# Patient Record
Sex: Female | Born: 1940
Health system: Southern US, Community
[De-identification: ages and names within clinical notes are randomized; demographics above are authoritative.]

## PROBLEM LIST (undated history)

## (undated) HISTORY — PX: TOTAL HIP ARTHROPLASTY: SHX124

## (undated) HISTORY — PX: ABDOMINAL HYSTERECTOMY: SHX81

## (undated) HISTORY — PX: WRIST SURGERY: SHX841

---

## 1998-08-30 ENCOUNTER — Encounter: Admission: RE | Admit: 1998-08-30 | Discharge: 1998-08-30 | Payer: Self-pay | Admitting: Sports Medicine

## 1998-09-03 ENCOUNTER — Encounter: Admission: RE | Admit: 1998-09-03 | Discharge: 1998-09-03 | Payer: Self-pay | Admitting: Family Medicine

## 1999-07-01 ENCOUNTER — Encounter: Admission: RE | Admit: 1999-07-01 | Discharge: 1999-07-01 | Payer: Self-pay | Admitting: Sports Medicine

## 2000-01-22 ENCOUNTER — Encounter: Payer: Self-pay | Admitting: Sports Medicine

## 2000-01-22 ENCOUNTER — Encounter: Admission: RE | Admit: 2000-01-22 | Discharge: 2000-01-22 | Payer: Self-pay | Admitting: Sports Medicine

## 2000-01-22 ENCOUNTER — Encounter: Admission: RE | Admit: 2000-01-22 | Discharge: 2000-01-22 | Payer: Self-pay | Admitting: Family Medicine

## 2000-10-21 ENCOUNTER — Encounter: Admission: RE | Admit: 2000-10-21 | Discharge: 2000-10-21 | Payer: Self-pay | Admitting: Family Medicine

## 2001-01-11 ENCOUNTER — Encounter: Admission: RE | Admit: 2001-01-11 | Discharge: 2001-01-11 | Payer: Self-pay | Admitting: Family Medicine

## 2002-09-08 ENCOUNTER — Encounter: Admission: RE | Admit: 2002-09-08 | Discharge: 2002-09-08 | Payer: Self-pay | Admitting: Sports Medicine

## 2003-08-05 ENCOUNTER — Encounter: Payer: Self-pay | Admitting: Orthopedic Surgery

## 2003-08-05 ENCOUNTER — Ambulatory Visit (HOSPITAL_COMMUNITY): Admission: AD | Admit: 2003-08-05 | Discharge: 2003-08-06 | Payer: Self-pay | Admitting: Orthopedic Surgery

## 2004-11-22 ENCOUNTER — Ambulatory Visit (HOSPITAL_COMMUNITY): Admission: RE | Admit: 2004-11-22 | Discharge: 2004-11-22 | Payer: Self-pay | Admitting: Gastroenterology

## 2005-06-17 ENCOUNTER — Ambulatory Visit: Payer: Self-pay | Admitting: Sports Medicine

## 2005-06-17 ENCOUNTER — Encounter: Admission: RE | Admit: 2005-06-17 | Discharge: 2005-06-17 | Payer: Self-pay | Admitting: Sports Medicine

## 2006-11-11 ENCOUNTER — Encounter: Admission: RE | Admit: 2006-11-11 | Discharge: 2006-11-11 | Payer: Self-pay | Admitting: Sports Medicine

## 2007-01-06 ENCOUNTER — Ambulatory Visit: Payer: Self-pay | Admitting: Sports Medicine

## 2007-01-06 LAB — CONVERTED CEMR LAB
ALT: 17 units/L (ref 0–35)
AST: 15 units/L (ref 0–37)
Albumin: 4.6 g/dL (ref 3.5–5.2)
Alkaline Phosphatase: 59 units/L (ref 39–117)
BUN: 14 mg/dL (ref 6–23)
CO2: 24 meq/L (ref 19–32)
Calcium: 9.4 mg/dL (ref 8.4–10.5)
Chloride: 105 meq/L (ref 96–112)
Cholesterol: 225 mg/dL — ABNORMAL HIGH (ref 0–200)
Creatinine, Ser: 0.6 mg/dL (ref 0.40–1.20)
Glucose, Bld: 99 mg/dL (ref 70–99)
HDL: 64 mg/dL (ref >39)
LDL Cholesterol: 139 mg/dL — ABNORMAL HIGH (ref 0–99)
Potassium: 3.9 meq/L (ref 3.5–5.3)
Sodium: 140 meq/L (ref 135–145)
Total Bilirubin: 0.6 mg/dL (ref 0.3–1.2)
Total CHOL/HDL Ratio: 3.5
Total Protein: 7 g/dL (ref 6.0–8.3)
Triglycerides: 108 mg/dL (ref <150)
VLDL: 22 mg/dL (ref 0–40)

## 2007-01-26 ENCOUNTER — Ambulatory Visit: Payer: Self-pay | Admitting: Family Medicine

## 2007-01-28 ENCOUNTER — Ambulatory Visit: Payer: Self-pay | Admitting: Sports Medicine

## 2007-02-18 DIAGNOSIS — E785 Hyperlipidemia, unspecified: Secondary | ICD-10-CM | POA: Insufficient documentation

## 2007-02-18 DIAGNOSIS — M19049 Primary osteoarthritis, unspecified hand: Secondary | ICD-10-CM | POA: Insufficient documentation

## 2007-02-18 DIAGNOSIS — H1045 Other chronic allergic conjunctivitis: Secondary | ICD-10-CM | POA: Insufficient documentation

## 2007-06-21 ENCOUNTER — Encounter: Payer: Self-pay | Admitting: Sports Medicine

## 2007-06-22 ENCOUNTER — Encounter: Payer: Self-pay | Admitting: Sports Medicine

## 2007-11-17 ENCOUNTER — Encounter: Payer: Self-pay | Admitting: Sports Medicine

## 2008-01-17 ENCOUNTER — Ambulatory Visit: Payer: Self-pay | Admitting: Sports Medicine

## 2008-05-29 ENCOUNTER — Encounter (INDEPENDENT_AMBULATORY_CARE_PROVIDER_SITE_OTHER): Payer: Self-pay | Admitting: *Deleted

## 2008-05-30 ENCOUNTER — Ambulatory Visit: Payer: Self-pay | Admitting: Sports Medicine

## 2008-05-30 DIAGNOSIS — L570 Actinic keratosis: Secondary | ICD-10-CM | POA: Insufficient documentation

## 2008-05-30 LAB — CONVERTED CEMR LAB
AST: 18 units/L (ref 0–37)
Albumin: 4.4 g/dL (ref 3.5–5.2)
Alkaline Phosphatase: 56 units/L (ref 39–117)
BUN: 13 mg/dL (ref 6–23)
HDL: 63 mg/dL (ref >39)
LDL Cholesterol: 152 mg/dL — ABNORMAL HIGH (ref 0–99)
Potassium: 4.7 meq/L (ref 3.5–5.3)
Sodium: 140 meq/L (ref 135–145)
Total Protein: 7 g/dL (ref 6.0–8.3)
VLDL: 21 mg/dL (ref 0–40)

## 2008-06-02 ENCOUNTER — Encounter (INDEPENDENT_AMBULATORY_CARE_PROVIDER_SITE_OTHER): Payer: Self-pay | Admitting: *Deleted

## 2008-07-04 ENCOUNTER — Ambulatory Visit: Payer: Self-pay | Admitting: Sports Medicine

## 2008-07-04 DIAGNOSIS — C4490 Unspecified malignant neoplasm of skin, unspecified: Secondary | ICD-10-CM

## 2008-09-01 ENCOUNTER — Ambulatory Visit: Payer: Self-pay | Admitting: Sports Medicine

## 2008-09-25 ENCOUNTER — Encounter: Payer: Self-pay | Admitting: Family Medicine

## 2008-09-25 LAB — CONVERTED CEMR LAB: Triglycerides: 72 mg/dL

## 2008-10-10 ENCOUNTER — Encounter: Payer: Self-pay | Admitting: Sports Medicine

## 2008-11-09 ENCOUNTER — Encounter: Payer: Self-pay | Admitting: Family Medicine

## 2008-11-29 ENCOUNTER — Encounter: Payer: Self-pay | Admitting: Family Medicine

## 2009-01-16 ENCOUNTER — Ambulatory Visit: Payer: Self-pay | Admitting: Sports Medicine

## 2009-01-17 ENCOUNTER — Encounter: Payer: Self-pay | Admitting: Sports Medicine

## 2009-01-22 ENCOUNTER — Telehealth: Payer: Self-pay | Admitting: Family Medicine

## 2009-01-29 ENCOUNTER — Ambulatory Visit: Payer: Self-pay | Admitting: Sports Medicine

## 2009-02-12 LAB — CONVERTED CEMR LAB

## 2009-02-13 ENCOUNTER — Ambulatory Visit (HOSPITAL_COMMUNITY): Admission: RE | Admit: 2009-02-13 | Discharge: 2009-02-13 | Payer: Self-pay | Admitting: Family Medicine

## 2009-02-13 ENCOUNTER — Ambulatory Visit: Payer: Self-pay | Admitting: Family Medicine

## 2009-02-13 DIAGNOSIS — J301 Allergic rhinitis due to pollen: Secondary | ICD-10-CM

## 2009-02-13 LAB — CONVERTED CEMR LAB
ALT: 19 units/L (ref 0–35)
AST: 18 units/L (ref 0–37)
Alkaline Phosphatase: 62 units/L (ref 39–117)
Calcium: 10.4 mg/dL (ref 8.4–10.5)
Chloride: 101 meq/L (ref 96–112)
Creatinine, Ser: 0.63 mg/dL (ref 0.40–1.20)

## 2009-02-15 ENCOUNTER — Encounter: Payer: Self-pay | Admitting: Family Medicine

## 2009-02-23 ENCOUNTER — Encounter: Payer: Self-pay | Admitting: Family Medicine

## 2009-02-26 ENCOUNTER — Encounter: Payer: Self-pay | Admitting: *Deleted

## 2009-04-12 ENCOUNTER — Encounter: Payer: Self-pay | Admitting: Family Medicine

## 2009-04-23 ENCOUNTER — Encounter: Payer: Self-pay | Admitting: Family Medicine

## 2009-08-15 ENCOUNTER — Ambulatory Visit: Payer: Self-pay | Admitting: Sports Medicine

## 2009-08-16 ENCOUNTER — Encounter: Payer: Self-pay | Admitting: Family Medicine

## 2009-08-22 ENCOUNTER — Encounter: Payer: Self-pay | Admitting: Sports Medicine

## 2009-08-28 ENCOUNTER — Encounter: Payer: Self-pay | Admitting: Family Medicine

## 2009-09-21 ENCOUNTER — Ambulatory Visit: Payer: Self-pay | Admitting: Sports Medicine

## 2009-09-21 DIAGNOSIS — L980 Pyogenic granuloma: Secondary | ICD-10-CM | POA: Insufficient documentation

## 2009-10-02 ENCOUNTER — Encounter: Payer: Self-pay | Admitting: Family Medicine

## 2009-10-03 ENCOUNTER — Ambulatory Visit: Payer: Self-pay | Admitting: Family Medicine

## 2009-11-06 ENCOUNTER — Ambulatory Visit: Payer: Self-pay | Admitting: Sports Medicine

## 2009-11-06 DIAGNOSIS — IMO0002 Reserved for concepts with insufficient information to code with codable children: Secondary | ICD-10-CM

## 2009-11-12 ENCOUNTER — Ambulatory Visit: Payer: Self-pay | Admitting: Family Medicine

## 2009-11-12 DIAGNOSIS — S62109A Fracture of unspecified carpal bone, unspecified wrist, initial encounter for closed fracture: Secondary | ICD-10-CM | POA: Insufficient documentation

## 2009-12-31 ENCOUNTER — Encounter: Payer: Self-pay | Admitting: Family Medicine

## 2010-05-08 ENCOUNTER — Encounter: Payer: Self-pay | Admitting: Family Medicine

## 2010-11-22 ENCOUNTER — Telehealth (INDEPENDENT_AMBULATORY_CARE_PROVIDER_SITE_OTHER): Payer: Self-pay | Admitting: *Deleted

## 2010-12-05 ENCOUNTER — Ambulatory Visit: Payer: Self-pay | Admitting: Family Medicine

## 2010-12-05 ENCOUNTER — Encounter: Payer: Self-pay | Admitting: Family Medicine

## 2010-12-05 LAB — CONVERTED CEMR LAB
ALT: 14 units/L (ref 0–35)
AST: 16 units/L (ref 0–37)
Albumin: 4.6 g/dL (ref 3.5–5.2)
CO2: 23 meq/L (ref 19–32)
Calcium: 8.7 mg/dL (ref 8.4–10.5)
Chloride: 100 meq/L (ref 96–112)
Cholesterol: 168 mg/dL (ref 0–200)
Creatinine, Ser: 0.56 mg/dL (ref 0.40–1.20)
Potassium: 3.6 meq/L (ref 3.5–5.3)
Sodium: 134 meq/L — ABNORMAL LOW (ref 135–145)
Total Protein: 7.2 g/dL (ref 6.0–8.3)
Vit D, 25-Hydroxy: 44 ng/mL (ref 30–89)

## 2010-12-11 ENCOUNTER — Encounter: Payer: Self-pay | Admitting: Family Medicine

## 2010-12-13 ENCOUNTER — Encounter: Payer: Self-pay | Admitting: Family Medicine

## 2011-01-12 ENCOUNTER — Encounter: Payer: Self-pay | Admitting: Sports Medicine

## 2011-01-21 NOTE — Miscellaneous (Signed)
  Medications Added ALENDRONATE SODIUM 70 MG TABS (ALENDRONATE SODIUM) 1 by mouth q week       Clinical Lists Changes  Medications: Added new medication of ALENDRONATE SODIUM 70 MG TABS (ALENDRONATE SODIUM) 1 by mouth q week Observations: Added new observation of LLIMPORTMEDS: completed (12/31/2009 13:41)

## 2011-01-21 NOTE — Letter (Signed)
Summary: Wellness Visit Letter  [facility]  [address]   [place], [place] [address]   Phone: [phone]  Fax: [phone]    05/08/2010  Margaret Barton [address] [place], [place]  [address]  Dear Ms. Deangelo,   We are happy to let you know that since you are covered under Medicare you are able to have a FREE visit at the [facility] to discuss your HEALTH. This is a new benefit for Medicare.  There will be no co-payment.  At this visit you will meet with Luretha Murphy an expert in wellness and the nurse practitioner at our clinic.  At this visit we will discuss ways to keep you healthy and feeling well.  This visit will not replace your regular doctor visit and we cannot refill medications.  We may schedule future blood work, give shots if needed, or schedule tests to look for hidden problems.   You will need to plan to be here at least one hour to talk about your medical history, your current status, review all of your medications, and discuss your future plans for your health.  This information will be entered into your record for your doctor to have and review.  If you are interested in staying healthy, this type of visit can help.  Please call the office at: [phone], to schedule a "Medicare Wellness Visit".  The day of the visit you should bring in all of your medications, including any vitamins, herbs, over the counter products you take.  Make a list of all the other doctors that you see, so we know who they are. If you have any other health documents please bring them.  We look forward to helping you stay healthy.   Sincerely,   Luretha Murphy NP  Appended Document: Wellness Visit Letter mailed.

## 2011-01-21 NOTE — Progress Notes (Signed)
Summary: phn msg  Phone Note Call from Patient Call back at Home Phone [phone]   Caller: Patient Summary of Call: no appt for phys in Dec - would like to put cpe in [facility] clinic at 8:30 on 12/15 pls advise Initial call taken by: De Nurse,  November 22, 2010 8:40 AM    admin that is A ok to put her there. Please let her know Thanks!  Denny Levy MD  November 25, 2010 4:58 PM  appt set for 12/15 De Nurse  November 26, 2010 3:41 PM

## 2011-01-23 NOTE — Letter (Signed)
Summary: Lipid Letter  [facility]  [address]   [place], [place] [address]   Phone: [phone]  Fax: [phone]    12/11/2010  Jaline Pincock [address] [place], [place]  [address]  Dear Ms. 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 And all oft the other labs including blood sugar, electrolytes, kidney, liver function and Vitamin D were OK as well.   We could potentially think about decreasing your simvastatin to 20 mg a day--OR one 40 mg tab every other day. In fact, let's try that and recheck in about 4 months.  Have a Happy Holiday!  We have carefully reviewed your last lipid profile from 12/05/2010 and the results are noted below with a summary of recommendations for lipid management.    Cholesterol:       168     Goal: < 200   HDL "good" Cholesterol:   69     Goal: >45   LDL "bad" Cholesterol:   81     Goal: <100   Triglycerides:       88     Goal: <150     Adjunctive Measures (may lower LIPIDS and reduce risk of Heart Attack) include: Aerobic Exercise (20-30 minutes 3-4 times a week) Limit Alcohol Consumption Weight Reduction Aspirin 75-81 mg a day by mouth (if not allergic or contraindicated) Dietary Fiber 20-30 grams a day by mouth     Current Medications: 1)    Nasonex 50 Mcg/act  Susp (Mometasone furoate) .... 2 puffs two times a day each nostril for 3 daysn and then once daily 2)    Simvastatin 40 Mg  Tabs (Simvastatin) .Marland Kitchen.. 1 by mouth qhs 3)    Singulair 10 Mg Tabs (Montelukast sodium) .Marland Kitchen.. 1 by mouth qd 4)    Patanol 0.1 % Soln (Olopatadine hcl) .Marland Kitchen.. 1 drop each eye two times a day as needed redness / itching of eyes disp 3 m supply 5)    Estraderm 0.1 Mg/24hr Pttw (Estradiol) .... Apply as directed twice a week 6)    Alendronate Sodium 70 Mg Tabs (Alendronate sodium) .Marland Kitchen.. 1 by mouth q week  If you have any questions, please call. We appreciate being able to work with you.   Sincerely,    [facility] Denny Levy  MD  Appended Document: Lipid Letter mailed

## 2011-01-23 NOTE — Miscellaneous (Signed)
Clinical Lists Changes  Medications: Added new medication of ZITHROMAX 250 MG TABS (AZITHROMYCIN) sig 2 by mouth today and then 1 by mouth once daily for four more days - Signed Added new medication of TESSALON 200 MG CAPS (BENZONATATE) 1 by mouth q 8 hrs as needed cough do not crush, swallow whole pill - Signed Added new medication of TYLENOL WITH CODEINE #3 300-30 MG TABS (ACETAMINOPHEN-CODEINE) 1-2 by mouth q 6-8 as needed cough - Signed Rx of ZITHROMAX 250 MG TABS (AZITHROMYCIN) sig 2 by mouth today and then 1 by mouth once daily for four more days;  #5 x 1;  Signed;  Entered by: Denny Levy MD;  Authorized by: Denny Levy MD;  Method used: [facility] [phone]*, [address] [place], [place]  [address], Ph: [phone] or [phone], Fax: [phone] Rx of TESSALON 200 MG CAPS (BENZONATATE) 1 by mouth q 8 hrs as needed cough do not crush, swallow whole pill;  #45 x 1;  Signed;  Entered by: Denny Levy MD;  Authorized by: Denny Levy MD;  Method used: Electronically to [facility] [phone]*, [address] [place], [place]  [address], Ph: [phone] or [phone], Fax: [phone] Rx of TYLENOL WITH CODEINE #3 300-30 MG TABS (ACETAMINOPHEN-CODEINE) 1-2 by mouth q 6-8 as needed cough;  #30 x 1;  Signed;  Entered by: Denny Levy MD;  Authorized by: Denny Levy MD;  Method used: Telephoned to [facility] [phone]*, [address] [place], [place]  [address], Ph: [phone] or [phone], Fax: [phone] Observations: Added new observation of NKA: T (12/13/2010 11:44)    Prescriptions: TYLENOL WITH CODEINE #3 300-30 MG TABS (ACETAMINOPHEN-CODEINE) 1-2 by mouth q 6-8 as needed cough  #30 x 1   Entered and Authorized by:   Denny Levy MD   Signed by:   Denny Levy MD on 12/13/2010   Method used:   Telephoned to ...       [facility]  [address] [phone]* (retail)       [address]       [place], [place]  [address]       Ph: [phone] or [phone]       Fax:  [phone]   RxID:   [id number] TESSALON 200 MG CAPS (BENZONATATE) 1 by mouth q 8 hrs as needed cough do not crush, swallow whole pill  #45 x 1   Entered and Authorized by:   Denny Levy MD   Signed by:   Denny Levy MD on 12/13/2010   Method used:   Electronically to        [facility]  [address] [phone]* (retail)       [address]       [place], [place]  [address]       Ph: [phone] or [phone]       Fax: [phone]   RxID:   [id number] ZITHROMAX 250 MG TABS (AZITHROMYCIN) sig 2 by mouth today and then 1 by mouth once daily for four more days  #5 x 1   Entered and Authorized by:   Denny Levy MD   Signed by:   Denny Levy MD on 12/13/2010   Method used:   [facility]ron[facility] to        [facility]  [address] [phone]* (retail)       [address]       [place], [place]  [address]       Ph: [phone] or [phone]       Fax:  [phone]   RxID:   [id number]

## 2011-01-23 NOTE — Assessment & Plan Note (Signed)
Summary: cpe,df   Vital Signs:  Patient profile:   70 year old female Weight:      151.6 pounds Temp:     98.5 degrees F oral Pulse rate:   70 / minute Pulse rhythm:   regular BP sitting:   124 / 74  (left arm) Cuff size:   regular  Vitals Entered By: Loralee Pacas CMA (December 05, 2010 8:44 AM) CC: cpe   CC:  cpe.  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 Trying to follow a good diet, taking medicines regularly. Not having any problems with medicines, no myalgias and no fatigue.  her allergies pretty well controlledon singulair andnasonex   Current Medications (verified): 1)  Nasonex 50 Mcg/act  Susp (Mometasone Furoate) .... 2 Puffs Two Times A Day Each Nostril For 3 Daysn and Then Once Daily 2)  Simvastatin 40 Mg  Tabs (Simvastatin) .Marland Kitchen.. 1 By Mouth Qhs 3)  Singulair 10 Mg Tabs (Montelukast Sodium) .Marland Kitchen.. 1 By Mouth Qd 4)  Patanol 0.1 % Soln (Olopatadine Hcl) .Marland Kitchen.. 1 Drop Each Eye Two Times A Day As Needed Redness / Itching of Eyes Disp 3 M Supply 5)  Estraderm 0.1 Mg/24hr Pttw (Estradiol) .... Apply As Directed Twice A Week 6)  Alendronate Sodium 70 Mg Tabs (Alendronate Sodium) .Marland Kitchen.. 1 By Mouth Q Week  Allergies (verified): No Known Drug Allergies  Review of Systems       complete 14 point ros is negative  Physical Exam  General:  alert, well-developed, well-nourished, and well-hydrated.   Eyes:  vision grossly intact, pupils equal, pupils round, and pupils reactive to light.   Ears:  R ear normal and L ear normal.   Nose:  no external deformity.   Mouth:  good dentition, no gingival abnormalities, and pharynx pink and moist.   Neck:  supple, full ROM, and no masses.   Breasts:  deferred to GYN Lungs:  normal respiratory  effort and normal breath sounds.   Heart:  normal rate, regular rhythm, and no murmur.   Abdomen:  soft.   Genitalia:  deferred to gyn Msk:  B hips some loss of ir/er/ so otherwise msk exam is normalme OA changed fingers Pulses:  2+ B radial and DP Extremities:  no edema Neurologic:  alert & oriented X3, gait normal, and DTRs symmetrical and normal.   Skin:  lots of solar damage (followed by derm so complete skin exam not done) Psych:  Oriented X3, memory intact for recent and remote, normally interactive, good eye contact, not anxious appearing, and not depressed appearing.      Impression & Recommendations:  Problem # 1:  PREVENTIVE HEALTH CARE (ICD-V70.0)  has had recent mammo and flu shot pneumovax and colonoscopy oin date will check vit D level  Orders: [facility] - [facility]  65+ [phone])  Problem # 2:  HYPERLIPIDEMIA (ICD-272.4)  Her updated medication list for this problem includes:    Simvastatin 40 Mg Tabs (Simvastatin) .Marland Kitchen... 1 by mouth qhs  Orders: Comp [facility] ([address]454-09811) Lipid[facility] ([address]1478-29562)  Complete Medication List: 1)  Nasonex 50 Mcg/act Susp (Mometasone furoate) .... 2 puffs two times a day each nostril for 3 daysn and then once daily 2)  Simvastatin 40 Mg Tabs (Simvastatin) .Marland KitchenMarland KitchenMarland Kitchen 1  by mouth qhs 3)  Singulair 10 Mg Tabs (Montelukast sodium) .Marland Kitchen.. 1 by mouth qd 4)  Patanol 0.1 % Soln (Olopatadine hcl) .Marland Kitchen.. 1 drop each eye two times a day as needed redness / itching of eyes disp 3 m supply 5)  Estraderm 0.1 Mg/24hr Pttw (Estradiol) .... Apply as directed twice a week 6)  Alendronate Sodium 70 Mg Tabs (Alendronate sodium) .Marland Kitchen.. 1 by mouth q week  Other Orders: Vit D, 25 [facility] [phone])   Orders Added: 1)  Comp [facility] [63875-64332] 2)  Lipid[facility] [80061-22930] 3)  Vit D, 25 [facility] [95188-41660] 4)  [facility] - Est  65+ [[address]]    Prevention & Chronic Care Immunizations   Influenza vaccine: Fluvax MCR  (10/03/2009)    Tetanus booster: 02/13/2009: given    Tetanus booster due: 02/13/2019    Pneumococcal vaccine: given  (02/13/2009)   Pneumococcal vaccine due: None    H. zoster vaccine: Not documented  Colorectal Screening   Hemoccult: Not documented    Colonoscopy: normal  (10/24/2004)   Colonoscopy due: 10/24/2014  Other Screening   Pap smear: s/p hysterectomy USO  (02/12/2009)   Pap smear due: Not Indicated    Mammogram: normal  (10/24/2008)   Mammogram due: 10/24/2009    DXA bone density scan: 09/25/2008 lumbar spine -2.0 osteopenia left hip neck -1.7  note right hip is thr  Dec 04, 2005 lumbar spine  -1.5  left hip neck -0.9  05/31/2003 lumbar spine -2.9 left hip neck  -1.7  (04/23/2009)   DXA scan due: 10/26/2010    Smoking status: never  (10/03/2009)  Lipids   Total Cholesterol: 204  (04/23/2009)   LDL: 120  (04/23/2009)   LDL Direct: Not documented   HDL: 70  (04/23/2009)   Triglycerides: 72  (09/25/2008)    SGOT (AST): 18  (02/13/2009)   SGPT (ALT): 19  (02/13/2009) CMP ordered    Alkaline phosphatase: 62  (02/13/2009)   Total bilirubin: 0.4  (02/13/2009)  Self-Management Support :    Lipid self-management support: Not documented

## 2011-02-28 ENCOUNTER — Encounter (INDEPENDENT_AMBULATORY_CARE_PROVIDER_SITE_OTHER): Payer: Self-pay | Admitting: *Deleted

## 2011-03-04 ENCOUNTER — Encounter: Payer: Self-pay | Admitting: Home Health Services

## 2011-03-04 ENCOUNTER — Other Ambulatory Visit: Payer: Self-pay | Admitting: Family Medicine

## 2011-03-04 MED ORDER — MONTELUKAST SODIUM 10 MG PO TABS: 10.0000 mg | ORAL_TABLET | Freq: Every day | ORAL | Status: DC

## 2011-03-04 NOTE — Miscellaneous (Signed)
  Clinical Lists Changes  Medications: Added new medication of LORAZEPAM 1 MG TABS (LORAZEPAM) 1/2 - 1 by mouth two times a day as needed anxiety - Signed Rx of SINGULAIR 10 MG TABS (MONTELUKAST SODIUM) 1 by mouth qd;  #30 x 12;  Signed;  Entered by: Denny Levy MD;  Authorized by: Denny Levy MD;  Method used: Electron[facility] to [facility] [phone]*, [address] [place], [place]  [address], Ph: [phone] or [phone], Fax: [phone] Rx of LORAZEPAM 1 MG TABS (LORAZEPAM) 1/2 - 1 by mouth two times a day as needed anxiety;  #30 x 0;  Signed;  Entered by: Denny Levy MD;  Authorized by: Denny Levy MD;  Method used: Telephoned to [facility]  [facility] [phone]*, [address], [place], [place]  [address], Ph: [phone] or [phone], Fax: [phone]  Sherell Christoffel please call in the ativan fo her Thanks! Denny Levy MD  Called in.  Lillia Pauls CMA  February 28, 2011 11:58 AM     Prescriptions: LORAZEPAM 1 MG TABS (LORAZEPAM) 1/2 - 1 by mouth two times a day as needed anxiety  #30 x 0   Entered and Authorized by:   Denny Levy MD   Signed by:   Denny Levy MD on 02/28/2011   Method used:   Telephoned to ...       [facility]  [address] [phone]* (retail)       [address]       [place], [place]  [address]       Ph: [phone] or [phone]       Fax: [phone]   RxID:   [id number] SINGULAIR 10 MG TABS (MONTELUKAST SODIUM) 1 by mouth qd  #30 x 12   Entered and Authorized by:   Denny Levy MD   Signed by:   Denny Levy MD on 02/28/2011   Method used:   Electronically to        [facility]  [address] [phone]* (retail)       [address]       [place], [place]  [address]       Ph: [phone] or [phone]       Fax: [phone]   RxID:   [id number]

## 2011-03-11 ENCOUNTER — Encounter: Payer: Self-pay | Admitting: Family Medicine

## 2011-03-20 NOTE — Miscellaneous (Signed)
  Clinical Lists Changes       Complete Medication List: 1)  Nasonex 50 Mcg/act Susp (Mometasone furoate) .... 2 puffs two times a day each nostril for 3 daysn and then once daily 2)  Simvastatin 40 Mg Tabs (Simvastatin) .Marland Kitchen.. 1 by mouth qhs 3)  Singulair 10 Mg Tabs (Montelukast sodium) .Marland Kitchen.. 1 by mouth qd 4)  Patanol 0.1 % Soln (Olopatadine hcl) .Marland Kitchen.. 1 drop each eye two times a day as needed redness / itching of eyes disp 3 m supply 5)  Estraderm 0.1 Mg/24hr Pttw (Estradiol) .... Apply as directed twice a week 6)  Alendronate Sodium 70 Mg Tabs (Alendronate sodium) .Marland Kitchen.. 1 by mouth q week 7)  Zithromax 250 Mg Tabs (Azithromycin) .... Sig 2 by mouth today and then 1 by mouth once daily for four more days 8)  Tessalon 200 Mg Caps (Benzonatate) .Marland Kitchen.. 1 by mouth q 8 hrs as needed cough do not crush, swallow whole pill 9)  Tylenol With Codeine #3 300-30 Mg Tabs (Acetaminophen-codeine) .Marland Kitchen.. 1-2 by mouth q 6-8 as needed cough 10)  Lorazepam 1 Mg Tabs (Lorazepam) .... 1/2 - 1 by mouth two times a day as needed anxiety   Past History:  Past Medical History: Last updated: 2009/02/24 endometriosis s/p hysterectomy USO  pneumonia 2001 removal squamous cell carcinoma tibia  Past Surgical History: Last updated: 2009/02/24 hystectomy/USO - 08/22/1984,  Lipid Panel 01/06/2007  TC=225, LDL=139, HDL=64, TG=108 -  left wrist fx with plate repair R THR Dr Thurston Hole  Family History: Last updated: February 24, 2009 father died 64 copd,  Margaret Barton (younger brothe)r  with MR and hyperlipidemia, s/p MI mother died 50 MI and aodm/ depression,  sister 80 A&W  Social History: Last updated: February 24, 2009 retired Financial controller for [employer];  no tobacco;  social etoh;  lives with husband Margaret Barton [phone]) several grandchildren 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 ctive in gardening, still working part  time.

## 2011-04-07 ENCOUNTER — Ambulatory Visit: Payer: Self-pay | Admitting: Home Health Services

## 2011-04-14 ENCOUNTER — Ambulatory Visit (INDEPENDENT_AMBULATORY_CARE_PROVIDER_SITE_OTHER): Payer: Medicare Other | Admitting: Home Health Services

## 2011-04-14 ENCOUNTER — Encounter: Payer: Self-pay | Admitting: Home Health Services

## 2011-04-14 VITALS — BP 131/74 | HR 69 | Temp 98.1°F | Ht 62.5 in | Wt 150.0 lb

## 2011-04-14 DIAGNOSIS — Z0000 Encounter for general adult medical examination without abnormal findings: Secondary | ICD-10-CM

## 2011-04-14 NOTE — Patient Instructions (Signed)
1. Continue to work on fitting into clothes in the closet. 2. Finalize your medical wishes, discuss with family, give copy to Dr. Jennette Kettle. 3. Enjoy your grandchildren!

## 2011-04-14 NOTE — Progress Notes (Signed)
Patient here for annual wellness visit, patient reports: Risk Factors/Conditions needing evaluation or treatment: Patient does not have any risk factors that need evaluation. Home Safety: Patient lives in 2 story home with husband.  Patient reports having smoke detectors and does not have adaptive equipment in bathrooms. Other Information: Corrective lens: Patient wears corrective lens for reading and visits eye doctor annually. Dentures: Patient does not have dentures and visits dentist annually. Memory: Patient denies memory loss. Patient's Mini Mental Score (recorded in doc. flowsheet): 30  Balance max value patientvalue  Sitting balance 1 1  Arise 2 2  Attempts to arise 2 2  Immediate standing balance 2 2  Standing balance 1 1  Nudge 2 2  Eyes closed 1 1  360 degree turn 1 1  Sitting down 2 2   Gait max value patient value  Initiation of gait 1 1  Step length-left 1 1  Step length-right 1 1  Step height-left 1 1  Step height-right 1 1  Step symmetry 1 1  Step continuity 1 1  Path 2 2  Trunk 2 2  Walking stance 1 1   Balance/Gait Score: 26/26    Annual Wellness Visit Requirements Recorded Today In  Medical, family, social history Past Medical, Family, Social History Section  Current providers Care team  Current medications Medications  Wt, BP, Ht, BMI Vital signs  Hearing assessment (welcome visit) declined  Tobacco, alcohol, illicit drug use History  ADL Nurse Assessment  Depression Screening Nurse Assessment  Cognitive impairment Nurse Assessment  Mini Mental Status Document Flowsheet  Fall Risk Nurse Assessment  Home Safety Progress Note  End of Life Planning (welcome visit) Social Documentation  Medicare preventative services Progress Note  Risk factors/conditions needing evaluation/treatment Progress Note  Personalized health advice Patient Instructions, goals, letter  Diet & Exercise Social Documentation  Emergency Contact Social Documentation  Seat  Belts Social Documentation  Sun exposure/protection Social Documentation    Prevention Plan: Recommended patient follow up with pharmacy for shingles vaccine.  Recommended Medicare Prevention Screenings Women over 82 Test For Frequency Date of Last- BOLD if needed  Breast Cancer 1-2 yrs 11/09  Cervical Cancer 1-3 yrs hysterectomy  Colorectal Cancer 1-10 yrs 11/05  Osteoporosis once 5/10  Cholesterol 5 yrs 12/11  Diabetes yearly Non diabetic  HIV yearly declined  Influenza Shot yearly 9/11  Pneumonia Shot once 2/10  Zostavax Shot once recommended

## 2011-04-22 ENCOUNTER — Telehealth: Payer: Self-pay | Admitting: *Deleted

## 2011-04-22 NOTE — Telephone Encounter (Signed)
PA required for Singulair. Form placed in MD box. 

## 2011-04-24 NOTE — Telephone Encounter (Signed)
Received notice from  insurance company that Singulair has be denied because Singulair is not covered for diagnosis of perennial allergic rhinitis.

## 2011-04-25 ENCOUNTER — Encounter: Payer: Self-pay | Admitting: Home Health Services

## 2011-04-25 NOTE — Telephone Encounter (Signed)
LVM for patient to call back. ?

## 2011-04-25 NOTE — Telephone Encounter (Signed)
Dear Cliffton Asters Team Please call her and tell her that her INSURANCE has denied the singulair for coverage. I think we went through this once before---she can still fill rx but it may be pretty pricey---she can APPEAl to her insurance company--I filled out the prior auth and it was denied. If she has questions please let me know and I wll call her THANKS! Denny Levy

## 2011-04-25 NOTE — Progress Notes (Signed)
  Subjective:    Patient ID: Margaret Barton, female    DOB: 1941-01-10, 70 y.o.   MRN: [id number]  HPI    Review of Systems     Objective:   Physical Exam        Assessment & Plan:  I have reviewed this visit and discussed with Arlys John and agree with her documentation.  Denny Levy

## 2011-04-25 NOTE — Telephone Encounter (Signed)
Also discussed her brother's death and she is doing much better from  grieft rx standpoint. Used about half of th meds I gave her. Briefly discussed recurrences and possible anniversary rx

## 2011-04-25 NOTE — Telephone Encounter (Signed)
Dear Cliffton Asters Team That is a REALLY good question. I imagine it was her private ins--the BEST thing she could do is  Call her PHARMACIST and talk with him about it. If there is another form to fill out I will be happy to do that I have called her and conveyed this

## 2011-04-25 NOTE — Telephone Encounter (Signed)
Patient had called back and she was wanting to know if all 3 of her insurance companies have denied the rx?

## 2011-04-25 NOTE — Telephone Encounter (Signed)
Called back and left message  on voicemail that if she wants RX billed to another insurance she should contact her pharmacy with that information , then if it does not go thru they will send [place] information to contact insurance company., but she has to give pharmacy other insurance info to get the process going.Margaret Barton

## 2011-05-09 NOTE — Op Note (Signed)
NAMEJATON, Margaret Barton                  ACCOUNT NO.:  [id number]   MEDICAL RECORD NO.:  [id number]          PATIENT TYPE:  AMB   LOCATION:  ENDO                         FACILITY:  [facility]   PHYSICIAN:  John C. Madilyn Fireman, M.D.    DATE OF BIRTH:  16-Mar-1941   DATE OF PROCEDURE:  11/22/2004  DATE OF DISCHARGE:                                 OPERATIVE REPORT   PROCEDURE:  Colonoscopy.   INDICATION FOR PROCEDURE:  Average risk colon cancer screening.   DESCRIPTION OF PROCEDURE:  The patient was placed in the left lateral  decubitus position and placed on the pulse monitor with continuous low-flow  oxygen delivered by nasal cannula.  She was sedated with 50 mcg IV fentanyl  and 6 mg IV Versed.  The Olympus video colonoscope was inserted into the  rectum and advanced to the cecum, confirmed by transillumination at  McBurney's point and visualization at the ileocecal valve and appendiceal  orifice.  The prep was excellent.  The cecum, ascending, transverse,  descending, and sigmoid colon all appeared normal with no masses, polyps,  diverticula, or other mucosal abnormalities.  The rectum likewise appeared  normal, and retroflexed view of the anus revealed no obvious internal  hemorrhoids.  The scope was then withdrawn and the patient returned to the  recovery room in stable condition.  She tolerated the procedure well, and  there were no immediate complications.   IMPRESSION:  Normal colonoscopy.   PLAN:  Next colon screening by sigmoidoscopy in 5 years.      [facility]/MEDQ  D:  11/22/2004  T:  11/22/2004  Job:  [id number]   cc:   Sibyl Parr. Darrick Penna, M.D.  Fax: [phone]

## 2011-05-09 NOTE — Op Note (Signed)
NAME:  Margaret Barton, Margaret Barton NO.:  [id number]   MEDICAL RECORD NO.:  [id number]                   PATIENT TYPE:  OIB   LOCATION:  2899                                 FACILITY:  [facility]   PHYSICIAN:  Dionne Ano. Everlene Other, M.D.         DATE OF BIRTH:  1941/09/09   DATE OF PROCEDURE:  08/05/2003  DATE OF DISCHARGE:                                 OPERATIVE REPORT   PREOPERATIVE DIAGNOSES:  1. Comminuted, complex intraarticular left distal radius fracture.  2. Left ulnar styloid fracture.   POSTOPERATIVE DIAGNOSES:  1. Comminuted, complex intraarticular left distal radius fracture.  2. Left ulnar styloid fracture.  3. Scapholunate subtle instability noted on stress radiography.   PROCEDURES PERFORMED:  1. Open reduction and internal fixation left radius fracture with long DVR     plate and screws and AlloMatrix bone grafting from [facility].  2. Close treatment on the styloid fracture.  3. Pinning scapholunate interosseous ligament secondary to subtle     scapholunate instability.  4. Stress radiography.   SURGEON:  Dionne Ano. Amanda Pea, M.D.   ASSISTANT:  None.   COMPLICATIONS:  None.   TOURNIQUET TIME:  Less than 90 minutes.   DRAINS:  One.   ESTIMATED BLOOD LOSS:  Minimal.   INDICATIONS FOR PROCEDURE:  This patient is a very pleasant 70 year old  female who presents with the above mentioned diagnoses. After counseling  with regards to the risks and benefits of the surgery including  risks of  infection, bleeding, anesthesia, damage to normal structures and failure of  the surgery to accomplish its intended goals of  relieving symptoms and  restoring function.  With this in mind, she desires to proceed.  All  questions have been encouraged and answered preoperatively.   OPERATIVE FINDINGS:  The patient had a very comminuted radius fracture and  underwent  open reduction and internal fixation without difficulty. She  tolerated  the procedure  well and there were no complicating features with  her operation. She did have some subtle scapholunate instability and I  pinned this and will remove the pins at  the approximately 2 to 3 week mark.  I have discussed  with the family these issues. I was very pleased with her  stability and alignment and restoration of the volar tilt, radial height and  inclination. She had excellent range of motion at the conclusion of the case  and no instability.   DESCRIPTION OF PROCEDURE:  The patient was seen by myself and Anesthesia.  She was given preoperative antibiotics. Consent was signed. All questions  were encouraged and answered. She was taken to the operative suite and  underwent  the smooth induction of general anesthesia under the direction of  Dr. Bedelia Person. She was then turned and appropriately padded in the bed and  had a tourniquet applied. Following  this we performed a sterile prep and  drape of  her left upper extremity with Betadine scrub and paint and once the  sterile field was secured the arm was elevated and the tourniquet was  insufflated to 250 mmHg.   A volar radial incision was made overlying the FCR. This was split about  both sides to the tendon sheath and retracted out of  harm's way. I then  identified the FPL and carpal canal contents and swept them in a radial to  ulnar direction. The pronator quadratus was then identified  and sized and  elevated. The very comminuted fracture was identified and irrigated. I  performed suction and bled the hematoma.   Following  this I then pieced together meticulously and tediously under 4  point serial loupe magnification her fracture pattern volarly. I was able to  achieve provisional fixation with the radial styloid Kirschner wire. She  tolerated  this well. Once this was done I was able to place a long DVR  plate with multiple screw options in the distal portion. This was done to my  satisfaction. I checked this under x-ray to  ensure that the plate fit nicely  and to my satisfaction. I was able to realign her volar tilt, inclination  and radial height excellently.   Provisional fixation was accomplished with the Kirschner wire and following  this the plate was applied. There was a very large metaphyseal defect noted.  I was able to achieve what I felt was excellent reduction and then applied  the plate in standard AO technique. The patient tolerated this well. The  distal pegs had excellent purchase and where just off the subchondral bone  without entering into the joint. I was able to identify this under  fluoroscopy to ensure there were no pins penetrating into the joint. All  looked quite well.   At this time I then packed this with AlloMatrix bone graft from [facility] to my satisfaction under fluoroscopy as well. The patient had a  smooth arc of motion. The ulnar styloid fracture was fairly stable and thus  I treated this closed at this time. Under stress radiography she did have  some instability subtly in the scapholunate interval, and thus in addition  to the radial styloid pin I advanced two 0.045 Kirschner wires into the  scapholunate interval and will leave these in for a brief period of time.  This was of course done through a small  stab incision just off of the  radial styloid region. Dissection was carried down to avoid injury to the  superficial radial nerve.   Thus the patient underwent  pinning of the scapholunate interosseous  interval about the scapholunate area due to instability. She also underwent  stress x-ray, closed treatment of the ulnar styloid fracture and open  reduction and internal fixation of the radius. All went quite well and I was  extremely pleased with her parameters.   Once this was done I then deflated the tourniquet. I  obtained hemostasis. I  repaired the pronator quadratus and placed a TLS drain size 7 in the area and then closed the wound in layers of 3-0  Vicryl followed  by Prolene in  the skin edge. The drain was hooked up to suction. A sterile dressing was  applied and all compartments were palpated and noted to be soft. I was  pleased with this and the findings and noted no interoperative complications  with the surgery. Once she had a long-arm splint, thumb spica and nature  applied, I then  removed the drapes.   She was then taken to the recovery room after exacerbation where she was  noted to be stable. All sponge, instrument and needle counts were reported  as correct. We will look forward to participating in her care  postoperatively. I have  discussed with the family all findings. She will be placed on  appropriate  pain medications, IV antibiotics. We will watch her compartments as well as  carpal tunnel closely. All questions have been encouraged and answered.                                                Dionne Ano. Everlene Other, M.D.    Nash Mantis  D:  08/05/2003  T:  08/05/2003  Job:  [id number]

## 2011-07-17 ENCOUNTER — Other Ambulatory Visit: Payer: Self-pay | Admitting: *Deleted

## 2011-07-17 MED ORDER — PREDNISONE 20 MG PO TABS: 20.0000 mg | ORAL_TABLET | Freq: Two times a day (BID) | ORAL | Status: DC

## 2011-07-17 NOTE — Progress Notes (Signed)
Per pt she has severe poison ivy on feet.  Dr. Darrick Penna rx'd prednisone.

## 2011-09-23 ENCOUNTER — Encounter: Payer: Self-pay | Admitting: Family Medicine

## 2011-09-23 NOTE — Progress Notes (Signed)
RE SINGULAIR Ms Foresta has to go thru prior authorization every year for singulair. She has in the past tried otc loratadine and allegra with increased dry moyth w both. I will again fill out this

## 2011-10-08 ENCOUNTER — Ambulatory Visit (INDEPENDENT_AMBULATORY_CARE_PROVIDER_SITE_OTHER): Payer: Medicare Other | Admitting: Family Medicine

## 2011-10-08 ENCOUNTER — Encounter: Payer: Self-pay | Admitting: Family Medicine

## 2011-10-08 VITALS — BP 124/72 | HR 68 | Temp 97.5°F | Ht 62.0 in | Wt 147.0 lb

## 2011-10-08 DIAGNOSIS — Z23 Encounter for immunization: Secondary | ICD-10-CM

## 2011-10-08 DIAGNOSIS — H1045 Other chronic allergic conjunctivitis: Secondary | ICD-10-CM

## 2011-10-08 DIAGNOSIS — Z79899 Other long term (current) drug therapy: Secondary | ICD-10-CM

## 2011-10-08 DIAGNOSIS — Z0000 Encounter for general adult medical examination without abnormal findings: Secondary | ICD-10-CM

## 2011-10-08 DIAGNOSIS — E785 Hyperlipidemia, unspecified: Secondary | ICD-10-CM

## 2011-10-08 LAB — COMPREHENSIVE METABOLIC PANEL
ALT: 20 U/L (ref 0–35)
Albumin: 4.8 g/dL (ref 3.5–5.2)
CO2: 25 mEq/L (ref 19–32)
Chloride: 102 mEq/L (ref 96–112)
Glucose, Bld: 102 mg/dL — ABNORMAL HIGH (ref 70–99)
Potassium: 4.1 mEq/L (ref 3.5–5.3)
Sodium: 140 mEq/L (ref 135–145)
Total Protein: 7.5 g/dL (ref 6.0–8.3)

## 2011-10-08 LAB — CBC WITH DIFFERENTIAL/PLATELET
Basophils Relative: 1 % (ref 0–1)
HCT: 44.3 % (ref 36.0–46.0)
Hemoglobin: 14.7 g/dL (ref 12.0–15.0)
Lymphs Abs: 1.8 10*3/uL (ref 0.7–4.0)
MCH: 30.7 pg (ref 26.0–34.0)
MCHC: 33.2 g/dL (ref 30.0–36.0)
Monocytes Absolute: 0.5 10*3/uL (ref 0.1–1.0)
Monocytes Relative: 9 % (ref 3–12)
Neutro Abs: 3.4 10*3/uL (ref 1.7–7.7)
Neutrophils Relative %: 58 % (ref 43–77)
RBC: 4.79 MIL/uL (ref 3.87–5.11)

## 2011-10-08 LAB — LIPID PANEL
Cholesterol: 201 mg/dL — ABNORMAL HIGH (ref 0–200)
LDL Cholesterol: 109 mg/dL — ABNORMAL HIGH (ref 0–99)
Triglycerides: 110 mg/dL (ref <150)
VLDL: 22 mg/dL (ref 0–40)

## 2011-10-08 MED ORDER — MONTELUKAST SODIUM 10 MG PO TABS: 10.0000 mg | ORAL_TABLET | Freq: Every day | ORAL | Status: DC

## 2011-10-08 MED ORDER — OLOPATADINE HCL 0.1 % OP SOLN: 1.0000 [drp] | Freq: Two times a day (BID) | OPHTHALMIC | Status: DC

## 2011-10-08 MED ORDER — SIMVASTATIN 40 MG PO TABS: 40.0000 mg | ORAL_TABLET | Freq: Every day | ORAL | Status: DC

## 2011-10-08 MED ORDER — LORAZEPAM 1 MG PO TABS: ORAL_TABLET | ORAL | Status: DC

## 2011-10-08 NOTE — Patient Instructions (Signed)
Great to see you! I will send you know that your blood work. I agree with stopping the Fosamax. I will give your flu shot today please continue to followup with the dermatologist. Please call me if you need anything in the interim. Continue your calcium and vitamin D. I would try to add 20 minutes of some type of weightbearing exercise 2-3 times a week.

## 2011-10-09 ENCOUNTER — Encounter: Payer: Self-pay | Admitting: Family Medicine

## 2011-10-09 NOTE — Progress Notes (Signed)
Subjective:    Patient ID: Margaret Barton, female    DOB: 1940/12/31, 70 y.o.   MRN: [id number]  HPI Here for preventive visit. #1. Has stopped her bisphosphonate. When she went to the dentist last, he found some erosion of the jaw bone. He recommended consideration of a known graft. She stopped her Fosamax at that time does not want to consider going back on it. She does bring me a recent bone density test.  #2. Her GYN care is handled by gynecologist. She continues on the Vivelle-Dot and calcium. She is also taking B complex vitamins.  #3. Osteoarthritis multiple sites. Status post total hip replacement. Status post wrist fracture with ORIF. Continues to be followed by Dr. Elliot Dally and he has placed her on Arthrotec which she uses intermittently and says is quite helpful.  #4. Allergic rhinitis and allergic conjunctivitis. Her insurance does not authorized payment for her Singulair. She's not sure what she wants to do about that since the Zyrtec and Claritin over-the-counter are not as helpful. She continues on the Patanol eyedrops. She also intermittently uses her nasal steroid spray.  #5. Hyperlipidemia. She continues on her Zocor without problem. No myalgias. She would like to get her cholesterol checked today   Review of Systems  Constitutional: Negative for fever, activity change, appetite change and unexpected weight change.  HENT: Positive for congestion, rhinorrhea and ear discharge. Negative for hearing loss and trouble swallowing.   Respiratory: Negative for cough, chest tightness and shortness of breath.   Cardiovascular: Negative for chest pain.  Gastrointestinal: Negative for nausea, abdominal pain, diarrhea and constipation.  Musculoskeletal: Positive for arthralgias. Negative for myalgias.  Skin: Negative for rash.  Neurological: Negative for weakness and headaches.  Psychiatric/Behavioral: Negative for confusion and agitation.       Objective:   Physical  Exam .Vital signs reviewed GENERALl: Well developed, well nourished, in no acute distress. NECK: Supple, FROM, without lymphadenopathy.  THYROID: normal without nodularity CAROTID ARTERIES: without bruits LUNGS: clear to auscultation bilaterally. No wheezes or rales. HEART: Regular rate and rhythm, no murmurs ABDOMEN: soft with positive bowel sounds NEURO: No gross focal deficits MSK: Deformity of the DIP & PIP joints consistent with osteoarthritis. Movement of extremities x4 with grossly normal strength. Skin: Multiple areas of solar damage, actinic change. I. healing area of punch biopsy on her left hand. No sign of infection. Psych: Alert and oriented x4. Normally interactive. Normal speech content. Asked and answered questions peripherally. Last period         Assessment & Plan:  #1. Preventive medicine. We'll check cholesterol panel, CMP. Flu shot. She gets her GYN care at outside facility and they have done her breast exam. Her tetanus shot, mammogram, colonoscopy are all up-to-date. She continues on calcium and vitamin D and is active. Her gynecologist is Dr. Rito Ehrlich.  #2. History of osteo-porosis/osteopenia. She has been on bisphosphonate until recently. She does not want to restart that. I reviewed her bone density exam today which shows her T score of the lumbar spine of -1.8. I reviewed her prior studies as well  with her. I recommend continuing the vitamin D and calcium. Given her bone loss in her jaw and her other issues I agree with discontinuation of the bisphosphonate. I would recheck a bone density in one year.  #3. Osteoarthritis. I agree with Arthrotec. We discussed precautions for GI upset. She is not taking it on a daily basis but when necessary basis. Check creatinine today.  #4. Hyperlipidemia.  Cholesterol panel today. Continue simvastatin. LFTs today.  #5. Upcoming anniversary reaction for the death of her brother in 03-04-2023. She had a hard time since last  year. I will give her a small prescription for benzodiazepine should she have recurrent issues.  #6. Allergic rhinitis and allergic conjunctivitis. Continue Patanol. Discussed Singulair. I think it's up to her whether or not she will supply for that or use the over-the-counter Claritin or Zyrtec. I did give her a one-month prescription in case . she wants to restart it quickly.  #7. Extensive solar damage to her skin. She continues to follow with Dr. Lenis Dickinson in dermatology. Recommend return to clinic one year for preventive up-to-date. I be happy to see her in the interim at any time if she meds.

## 2011-12-19 ENCOUNTER — Encounter: Payer: Self-pay | Admitting: Family Medicine

## 2011-12-19 ENCOUNTER — Encounter: Payer: Self-pay | Admitting: *Deleted

## 2011-12-19 NOTE — Progress Notes (Signed)
Patient ID: Margaret Barton, female   DOB: [birth year], 70 y.o.   MRN: [id number]  Authorization # for - pt's singulair is [id number] covered through 09/2012.

## 2012-04-15 ENCOUNTER — Other Ambulatory Visit: Payer: Self-pay | Admitting: *Deleted

## 2012-04-15 ENCOUNTER — Encounter: Payer: Self-pay | Admitting: Home Health Services

## 2012-04-15 MED ORDER — CEPHALEXIN 500 MG PO CAPS: 500.0000 mg | ORAL_CAPSULE | Freq: Two times a day (BID) | ORAL | Status: AC

## 2012-08-05 ENCOUNTER — Other Ambulatory Visit: Payer: Self-pay | Admitting: *Deleted

## 2012-08-05 MED ORDER — TRAMADOL HCL 50 MG PO TABS: 50.0000 mg | ORAL_TABLET | Freq: Four times a day (QID) | ORAL | Status: DC | PRN

## 2012-08-12 ENCOUNTER — Encounter: Payer: Self-pay | Admitting: Family Medicine

## 2012-11-03 ENCOUNTER — Ambulatory Visit (INDEPENDENT_AMBULATORY_CARE_PROVIDER_SITE_OTHER): Payer: Medicare Other | Admitting: Family Medicine

## 2012-11-03 ENCOUNTER — Encounter: Payer: Self-pay | Admitting: Family Medicine

## 2012-11-03 VITALS — BP 139/68 | HR 59 | Temp 97.7°F | Ht 62.0 in | Wt 151.9 lb

## 2012-11-03 DIAGNOSIS — F439 Reaction to severe stress, unspecified: Secondary | ICD-10-CM

## 2012-11-03 DIAGNOSIS — Z639 Problem related to primary support group, unspecified: Secondary | ICD-10-CM

## 2012-11-03 DIAGNOSIS — Z0000 Encounter for general adult medical examination without abnormal findings: Secondary | ICD-10-CM

## 2012-11-03 MED ORDER — CITALOPRAM HYDROBROMIDE 20 MG PO TABS: 20.0000 mg | ORAL_TABLET | Freq: Every day | ORAL | Status: DC

## 2012-11-03 MED ORDER — SIMVASTATIN 40 MG PO TABS: 40.0000 mg | ORAL_TABLET | Freq: Every day | ORAL | Status: DC

## 2012-11-03 MED ORDER — OLOPATADINE HCL 0.1 % OP SOLN: 1.0000 [drp] | Freq: Two times a day (BID) | OPHTHALMIC | Status: DC

## 2012-11-03 MED ORDER — MOMETASONE FUROATE 50 MCG/ACT NA SUSP: 2.0000 | Freq: Two times a day (BID) | NASAL | Status: DC

## 2012-11-03 MED ORDER — LORAZEPAM 1 MG PO TABS: ORAL_TABLET | ORAL | Status: DC

## 2012-11-03 MED ORDER — MONTELUKAST SODIUM 10 MG PO TABS: 10.0000 mg | ORAL_TABLET | Freq: Every day | ORAL | Status: DC

## 2012-11-03 NOTE — Patient Instructions (Addendum)
See me in 3-4 weeks. Please call me with any issues. I will call in a prescription. I have placed an order for your labwork area`

## 2012-11-05 NOTE — Progress Notes (Signed)
  Subjective:    Patient ID: Margaret Barton, female    DOB: 09-26-1941, 71 y.o.   MRN: [id number]  HPI Well adult health check. She recently saw her dermatologist. Her mammogram is updated. She's having no specific issues except a lot of increased stress at home. #2. Stressors. Related to her children. She's been quite anxious and a little irritable. Has occasionally used some of the medicine I gave her but is afraid she'll get "addicted to it". Denies suicidal or homicidal ideation. Concentration is decreased. Sleep is disrupted. More fatigued.   Review of Systems  Constitutional: Positive for fatigue. Negative for activity change and appetite change.  HENT: Positive for rhinorrhea, sneezing and postnasal drip. Negative for neck stiffness.   Eyes: Negative for pain and visual disturbance.  Respiratory: Negative for cough, shortness of breath and wheezing.   Genitourinary: Negative for frequency and difficulty urinating.  Musculoskeletal: Positive for arthralgias.  Skin: Negative for rash.  Neurological: Negative for speech difficulty and light-headedness.  Hematological: Does not bruise/bleed easily.  Psychiatric/Behavioral: Positive for sleep disturbance, dysphoric mood and decreased concentration. Negative for suicidal ideas, hallucinations, confusion and agitation.       Objective:   Physical Exam  Constitutional: She appears well-developed and well-nourished.  HENT:  Head: Normocephalic and atraumatic.  Right Ear: External ear normal.  Left Ear: External ear normal.  Nose: Nose normal.  Mouth/Throat: Oropharynx is clear and moist.  Eyes: Conjunctivae normal and EOM are normal. Pupils are equal, round, and reactive to light.  Neck: Normal range of motion. Neck supple. No thyromegaly present.  Cardiovascular: Normal rate and regular rhythm.   Pulmonary/Chest: Effort normal and breath sounds normal.  Musculoskeletal: Normal range of motion.  Neurological: She is alert.    Psychiatric: She has a normal mood and affect. Her behavior is normal. Judgment and thought content normal.          Assessment & Plan:  #1 Well adult health check. #2. Stressors. We spent most of our time discussing this. She agreed to start low-dose citalopram. I did refill her benzodiazepine for when necessary use. I'll see her back in 4 weeks.

## 2012-11-23 ENCOUNTER — Telehealth: Payer: Self-pay | Admitting: Family Medicine

## 2012-11-23 NOTE — Telephone Encounter (Signed)
Called pt and told her that the orders would be placed by the date of her appt (12/10). Pt voiced understanding.Loralee Pacas [name]

## 2012-11-23 NOTE — Telephone Encounter (Signed)
Pt was told to come back for her fasting labs and there are no orders in - pls add orders - she is coming on 12/10

## 2012-11-25 ENCOUNTER — Other Ambulatory Visit: Payer: Self-pay | Admitting: Family Medicine

## 2012-11-25 DIAGNOSIS — Z79899 Other long term (current) drug therapy: Secondary | ICD-10-CM | POA: Insufficient documentation

## 2012-11-25 DIAGNOSIS — Z0000 Encounter for general adult medical examination without abnormal findings: Secondary | ICD-10-CM

## 2012-11-25 DIAGNOSIS — E785 Hyperlipidemia, unspecified: Secondary | ICD-10-CM

## 2012-11-29 ENCOUNTER — Other Ambulatory Visit: Payer: Medicare Other

## 2012-11-29 DIAGNOSIS — E785 Hyperlipidemia, unspecified: Secondary | ICD-10-CM

## 2012-11-29 DIAGNOSIS — Z0000 Encounter for general adult medical examination without abnormal findings: Secondary | ICD-10-CM

## 2012-11-29 DIAGNOSIS — Z79899 Other long term (current) drug therapy: Secondary | ICD-10-CM

## 2012-11-29 LAB — COMPREHENSIVE METABOLIC PANEL
Albumin: 4.5 g/dL (ref 3.5–5.2)
BUN: 13 mg/dL (ref 6–23)
CO2: 27 mEq/L (ref 19–32)
Calcium: 9.2 mg/dL (ref 8.4–10.5)
Glucose, Bld: 89 mg/dL (ref 70–99)
Potassium: 4.1 mEq/L (ref 3.5–5.3)
Sodium: 141 mEq/L (ref 135–145)
Total Protein: 6.7 g/dL (ref 6.0–8.3)

## 2012-11-29 LAB — LIPID PANEL
Cholesterol: 179 mg/dL (ref 0–200)
HDL: 62 mg/dL (ref >39)
Triglycerides: 88 mg/dL (ref <150)

## 2012-11-29 NOTE — Progress Notes (Signed)
CMP AND FLP DONE TODAY Margaret Barton 

## 2012-11-30 ENCOUNTER — Encounter: Payer: Self-pay | Admitting: Family Medicine

## 2012-11-30 ENCOUNTER — Other Ambulatory Visit: Payer: Medicare Other

## 2012-12-20 ENCOUNTER — Ambulatory Visit (INDEPENDENT_AMBULATORY_CARE_PROVIDER_SITE_OTHER): Payer: TRICARE For Life (TFL) | Admitting: Family Medicine

## 2012-12-20 VITALS — BP 137/82 | HR 62 | Temp 98.5°F | Ht 62.0 in | Wt 151.0 lb

## 2012-12-20 DIAGNOSIS — Z733 Stress, not elsewhere classified: Secondary | ICD-10-CM

## 2012-12-20 DIAGNOSIS — F439 Reaction to severe stress, unspecified: Secondary | ICD-10-CM

## 2012-12-24 NOTE — Progress Notes (Signed)
  Subjective:    Patient ID: Margaret Barton, female    DOB: [birth year], 72 y.o.   MRN: [id number]  HPI  Followup starting SSRI. Feels like it has really made a difference as far as not having the extreme lows. Has been less tearful. That was invaluable to have this on board during the holidays. Denies suicidal or homicidal ideation. She sleeping better. Feels like her mood is more stable. She doesn't think she needs the medicine anymore although the situation at home has not essentially changed.  Review of Systems Denies unusual weight change, has had some vivid dreams but has had improved sleep.    Objective:   Physical Exam  Bow signs are reviewed GENERAL: Well-developed female no acute distress PSYCH: Alert oriented x4. Interactive. Asks and answers questions appropriately. Mood is appropriate. Speech is fluent and normal in content.      Assessment & Plan:  #1. Situational stressors. She's only been on the citalopram for a month. I really would like for her to go at least 3 months and we discussed at some length today she agrees and I will see her back at the him in 3 months. Alternatively if she decides to stop it before then instructed her how to taper off one every other day for a week, then q. every third day for a week.

## 2013-01-15 ENCOUNTER — Other Ambulatory Visit: Payer: Self-pay | Admitting: Family Medicine

## 2013-09-20 ENCOUNTER — Other Ambulatory Visit: Payer: Self-pay | Admitting: *Deleted

## 2013-09-20 MED ORDER — TRAMADOL HCL 50 MG PO TABS: 50.0000 mg | ORAL_TABLET | Freq: Four times a day (QID) | ORAL | Status: DC | PRN

## 2013-10-19 ENCOUNTER — Ambulatory Visit (INDEPENDENT_AMBULATORY_CARE_PROVIDER_SITE_OTHER): Payer: Medicare (Managed Care) | Admitting: Sports Medicine

## 2013-10-19 ENCOUNTER — Encounter: Payer: Self-pay | Admitting: Sports Medicine

## 2013-10-19 VITALS — BP 131/76 | HR 60 | Ht 62.0 in | Wt 151.0 lb

## 2013-10-19 DIAGNOSIS — S92912A Unspecified fracture of left toe(s), initial encounter for closed fracture: Secondary | ICD-10-CM

## 2013-10-19 DIAGNOSIS — S92919A Unspecified fracture of unspecified toe(s), initial encounter for closed fracture: Secondary | ICD-10-CM

## 2013-10-19 NOTE — Progress Notes (Signed)
  Subjective:    Patient ID: Margaret Barton, female    DOB: 05-May-1941, 72 y.o.   MRN: [id number]  HPI Margaret Barton comes in today complaining of left fifth toe pain. She injured the toe one month ago when she struck it on an object at home. She suffered a valgus type of injury to the left fifth toe. No obvious deformity but immediate pain and swelling. She has had previous toe fractures in the past and her pain feels similar to this. Although her pain is improving she is concerned about possible posttraumatic arthritis. She has been ambulating intermittently in a postop shoe. She does note that her pain is less in the postop shoe. She denies pain more proximally in her foot.  Interim medical history is unchanged   Review of Systems     Objective:   Physical Exam Well-developed, well-nourished. No acute distress. Awake alert and oriented x3. Vital signs are reviewed.  Examination of the left foot with attention to the left fifth toe shows mild swelling but no gross deformity. There is tenderness to palpation along the proximal phalanx. No tenderness to palpation along the distal fifth metatarsal. No obvious ecchymosis. Flexor and extensor tendons are intact. Patient is able to ambulate with only a very slight limp.  MSK ultrasound of the left fifth toe was performed. Images were obtained in both long and short axis. There is obvious cortical irregularity along the mid shaft of the proximal phalanx consistent with a fracture here. There is good early bridging callus formation appreciated both on the long and short view. Distal fifth metatarsal is unremarkable. MTP joint and PIP joint are both also unremarkable.       Assessment & Plan:  Healing left fifth toe fracture  Patient's injury is 33 weeks old. She is reassured that it seems to be healing adequately. She will buddy tape her fourth and fifth toes and she can continue with her postop shoe as needed for comfort. I explained to her that these  types of fractures are inherently stable but that she may still experience some discomfort for the next several weeks. She can slowly increase activity as her symptoms allow. I am happy to repeat her ultrasound in four-weeks if she continues to struggle. However, if she is doing well she can followup when necessary.

## 2013-10-20 ENCOUNTER — Other Ambulatory Visit: Payer: Medicare Other | Admitting: Sports Medicine

## 2013-11-16 ENCOUNTER — Other Ambulatory Visit: Payer: Self-pay | Admitting: Family Medicine

## 2013-12-02 ENCOUNTER — Other Ambulatory Visit: Payer: Self-pay | Admitting: *Deleted

## 2013-12-02 MED ORDER — HYDROCODONE-HOMATROPINE 5-1.5 MG/5ML PO SYRP: 5.0000 mL | ORAL_SOLUTION | Freq: Four times a day (QID) | ORAL | Status: DC | PRN

## 2013-12-02 NOTE — Progress Notes (Signed)
Ordered per Dr. Margaretha Sheffield.

## 2013-12-19 ENCOUNTER — Other Ambulatory Visit: Payer: Self-pay | Admitting: *Deleted

## 2013-12-19 MED ORDER — AZITHROMYCIN 250 MG PO TABS: ORAL_TABLET | ORAL | Status: DC

## 2014-01-25 ENCOUNTER — Encounter: Payer: Self-pay | Admitting: Family Medicine

## 2014-01-25 ENCOUNTER — Ambulatory Visit (INDEPENDENT_AMBULATORY_CARE_PROVIDER_SITE_OTHER): Payer: Medicare (Managed Care) | Admitting: Family Medicine

## 2014-01-25 VITALS — BP 140/52 | HR 59 | Temp 97.7°F | Ht 62.0 in | Wt 145.2 lb

## 2014-01-25 DIAGNOSIS — R059 Cough, unspecified: Secondary | ICD-10-CM

## 2014-01-25 DIAGNOSIS — Z0000 Encounter for general adult medical examination without abnormal findings: Secondary | ICD-10-CM

## 2014-01-25 DIAGNOSIS — E785 Hyperlipidemia, unspecified: Secondary | ICD-10-CM

## 2014-01-25 DIAGNOSIS — R05 Cough: Secondary | ICD-10-CM

## 2014-01-25 DIAGNOSIS — Z789 Other specified health status: Secondary | ICD-10-CM

## 2014-01-25 DIAGNOSIS — Z79890 Hormone replacement therapy (postmenopausal): Secondary | ICD-10-CM

## 2014-01-25 DIAGNOSIS — Z23 Encounter for immunization: Secondary | ICD-10-CM

## 2014-01-25 DIAGNOSIS — Z9189 Other specified personal risk factors, not elsewhere classified: Secondary | ICD-10-CM | POA: Insufficient documentation

## 2014-01-25 LAB — COMPREHENSIVE METABOLIC PANEL
ALT: 26 U/L (ref 0–35)
AST: 23 U/L (ref 0–37)
Albumin: 4.8 g/dL (ref 3.5–5.2)
Alkaline Phosphatase: 57 U/L (ref 39–117)
BUN: 15 mg/dL (ref 6–23)
CALCIUM: 9.6 mg/dL (ref 8.4–10.5)
CHLORIDE: 103 meq/L (ref 96–112)
CO2: 27 meq/L (ref 19–32)
Creat: 0.7 mg/dL (ref 0.50–1.10)
GLUCOSE: 99 mg/dL (ref 70–99)
Potassium: 4 mEq/L (ref 3.5–5.3)
Sodium: 141 mEq/L (ref 135–145)
Total Bilirubin: 0.5 mg/dL (ref 0.2–1.2)
Total Protein: 7.2 g/dL (ref 6.0–8.3)

## 2014-01-25 LAB — CBC WITH DIFFERENTIAL/PLATELET
Basophils Absolute: 0 10*3/uL (ref 0.0–0.1)
Basophils Relative: 1 % (ref 0–1)
EOS PCT: 2 % (ref 0–5)
Eosinophils Absolute: 0.1 10*3/uL (ref 0.0–0.7)
HCT: 41.6 % (ref 36.0–46.0)
Hemoglobin: 14.4 g/dL (ref 12.0–15.0)
LYMPHS ABS: 1.6 10*3/uL (ref 0.7–4.0)
LYMPHS PCT: 30 % (ref 12–46)
MCH: 30.4 pg (ref 26.0–34.0)
MCHC: 34.6 g/dL (ref 30.0–36.0)
MCV: 87.9 fL (ref 78.0–100.0)
MONO ABS: 0.7 10*3/uL (ref 0.1–1.0)
Monocytes Relative: 13 % — ABNORMAL HIGH (ref 3–12)
Neutro Abs: 2.8 10*3/uL (ref 1.7–7.7)
Neutrophils Relative %: 54 % (ref 43–77)
Platelets: 298 10*3/uL (ref 150–400)
RBC: 4.73 MIL/uL (ref 3.87–5.11)
RDW: 13.6 % (ref 11.5–15.5)
WBC: 5.2 10*3/uL (ref 4.0–10.5)

## 2014-01-25 LAB — LIPID PANEL
CHOLESTEROL: 193 mg/dL (ref 0–200)
HDL: 67 mg/dL (ref >39)
LDL Cholesterol: 106 mg/dL — ABNORMAL HIGH (ref 0–99)
TRIGLYCERIDES: 101 mg/dL (ref <150)
Total CHOL/HDL Ratio: 2.9 Ratio
VLDL: 20 mg/dL (ref 0–40)

## 2014-01-25 MED ORDER — TRAMADOL HCL 50 MG PO TABS: 50.0000 mg | ORAL_TABLET | Freq: Four times a day (QID) | ORAL | Status: DC | PRN

## 2014-01-25 MED ORDER — HYDROCODONE-HOMATROPINE 5-1.5 MG/5ML PO SYRP: 5.0000 mL | ORAL_SOLUTION | Freq: Four times a day (QID) | ORAL | Status: DC | PRN

## 2014-01-26 DIAGNOSIS — Z79890 Hormone replacement therapy (postmenopausal): Secondary | ICD-10-CM | POA: Insufficient documentation

## 2014-01-26 LAB — VITAMIN D 25 HYDROXY (VIT D DEFICIENCY, FRACTURES): Vit D, 25-Hydroxy: 35 ng/mL (ref 30–89)

## 2014-01-26 NOTE — Assessment & Plan Note (Addendum)
Gave her a flu shot today. Lab work. Her colonoscopy is in today. Her mammogram is in today. History of multiple fractures I'll also check a vitamin D level.

## 2014-01-26 NOTE — Assessment & Plan Note (Signed)
Check lipid panel and LFTs. Continue statin.

## 2014-01-26 NOTE — Progress Notes (Signed)
Subjective:    Patient ID: Margaret Barton, female    DOB: [birth year], 73 y.o.   MRN: [id number]  HPI  Here for CPE. Has not yet gotten her flu shot and wants to discuss that. She is open to getting the flu shot. #2. Her insurance is giving her a hard time about feeling her estradiol patch. 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 She does not want to stop. If she misses a few doses she gets skin changes, extreme vaginal dryness, mood lability and hair changes. #3. She continues to follow with her dermatologist for total skin check several times a year. #4. Allergic rhinitis and allergic conjunctivitis. Her rhinitis is not bothering her much right now she's not using the nasal spray. Her allergic conjunctivitis continues to be a problem although it is improved when she uses her eyedrops daily which she is doing. #5. Cough. She just came back from 3 weeks in [place]. While there they visited the [place] and she noticed that after that she had some cough. Has not quite gone away. It is not productive. It bothers her most at night. She's had no shortness of breath. #6. She notices that she has a lot of bruises on her arms. It seems like the smallest pain will cause a bruise to appear very she has some questions about that. #7. She continues to have worsening arthritis of her hands particularly her right hand. Her joints have enlarged but they're not red or tender. Mostly she has some stiffness of the hands and does not have the dexterity she used to have.  Review of Systems  Constitutional: Negative for activity change, appetite change and fatigue.  HENT: Negative for hearing loss.   Eyes: Positive for itching. Negative for photophobia, pain and visual disturbance.  Respiratory: Positive for cough. Negative for choking, chest tightness, shortness of breath and wheezing.   Cardiovascular: Negative for chest pain.  Gastrointestinal: Negative for  nausea, abdominal pain and blood in stool.  Genitourinary: Negative for dysuria, vaginal bleeding, difficulty urinating and pelvic pain.  Musculoskeletal: Positive for arthralgias and joint swelling.  Neurological: Negative for dizziness and weakness.  Hematological: Bruises/bleeds easily.  Psychiatric/Behavioral: Negative for confusion, sleep disturbance, dysphoric mood and agitation. The patient is not nervous/anxious.        Objective:   Physical Exam  Constitutional: She is oriented to person, place, and time. She appears well-developed and well-nourished.  HENT:  Right Ear: External ear normal.  Left Ear: External ear normal.  Nose: Nose normal.  Mouth/Throat: Oropharynx is clear and moist.  Eyes: Conjunctivae and EOM are normal. Pupils are equal, round, and reactive to light. No scleral icterus.  Neck: Normal range of motion. Neck supple. No thyromegaly present.  Cardiovascular: Normal rate, regular rhythm and normal heart sounds.   No murmur heard. Pulmonary/Chest: Effort normal and breath sounds normal.  She had one small expiratory wheeze at the right lung base which cleared after cough. Otherwise she has full normal lung sounds in all lung fields.  Abdominal: Soft. Bowel sounds are normal.  Musculoskeletal: Normal range of motion.  Multiple PIP and DIP joints of the hands particularly the right hand are enlarged but there is no erythema or warmth.  Lymphadenopathy:    She has no cervical adenopathy.  Neurological: She is alert and oriented to person, place, and time. She has normal reflexes. She exhibits normal muscle tone. Coordination normal.  Skin:  Extensive solar  damage face arms back.  Psychiatric: She has a normal mood and affect. Her behavior is normal. Judgment and thought content normal.          Assessment & Plan:  For her cough I gave her some cough syrup. I would expect this to go to fully away in the next 1-2 weeks. It does not, she is to let me know and  then I would probably recommend we do a chest x-ray. Likely it is from the ears patient from sulfuric acid in the air from the [place].

## 2014-02-21 ENCOUNTER — Encounter: Payer: Self-pay | Admitting: Family Medicine

## 2014-02-22 ENCOUNTER — Telehealth: Payer: Self-pay | Admitting: Family Medicine

## 2014-02-22 ENCOUNTER — Telehealth: Payer: Self-pay | Admitting: *Deleted

## 2014-02-22 NOTE — Telephone Encounter (Signed)
Fax from [employer] regarding standard appeal for denial of Vivelle-DOT.  A form placed in provider box to complete and sign.  Derl Barrow, RN

## 2014-02-23 NOTE — Telephone Encounter (Signed)
igned and faxed back Dorcas Mcmurray

## 2014-03-10 ENCOUNTER — Encounter: Payer: Self-pay | Admitting: Family Medicine

## 2014-06-16 ENCOUNTER — Ambulatory Visit (INDEPENDENT_AMBULATORY_CARE_PROVIDER_SITE_OTHER): Payer: Medicare (Managed Care) | Admitting: Family Medicine

## 2014-06-16 ENCOUNTER — Encounter: Payer: Self-pay | Admitting: Family Medicine

## 2014-06-16 VITALS — BP 132/79 | Ht 62.0 in | Wt 145.0 lb

## 2014-06-16 DIAGNOSIS — S61259A Open bite of unspecified finger without damage to nail, initial encounter: Secondary | ICD-10-CM

## 2014-06-16 DIAGNOSIS — W540XXA Bitten by dog, initial encounter: Secondary | ICD-10-CM

## 2014-06-16 DIAGNOSIS — S90569A Insect bite (nonvenomous), unspecified ankle, initial encounter: Secondary | ICD-10-CM

## 2014-06-16 DIAGNOSIS — S70361A Insect bite (nonvenomous), right thigh, initial encounter: Secondary | ICD-10-CM

## 2014-06-16 DIAGNOSIS — W57XXXA Bitten or stung by nonvenomous insect and other nonvenomous arthropods, initial encounter: Principal | ICD-10-CM

## 2014-06-16 DIAGNOSIS — S61209A Unspecified open wound of unspecified finger without damage to nail, initial encounter: Secondary | ICD-10-CM

## 2014-06-16 MED ORDER — DOXYCYCLINE HYCLATE 100 MG PO TABS: 100.0000 mg | ORAL_TABLET | Freq: Two times a day (BID) | ORAL | Status: DC

## 2014-06-16 MED ORDER — TRAMADOL HCL 50 MG PO TABS: 50.0000 mg | ORAL_TABLET | Freq: Four times a day (QID) | ORAL | Status: DC | PRN

## 2014-06-16 NOTE — Progress Notes (Signed)
   Subjective:    Patient ID: Margaret Barton, female    DOB: Aug 08, 1941, 73 y.o.   MRN: [id number]  HPI Had a dog bite on her little finger m (06/13/2014)that is starting to look a little infected. It was her grandson's dog. He was reaching for a snack accidentally got her on the finger. This was 2 days ago. She's had no joint pain but the area is starting to be more red. #2. Also has 2  tick bites. Unclear how long they were embedded. ( DATE of bite est. 06/13/2014)She brings the takes with her.   Review of Systems No unusual rash, no fever, sweats, chills. No headache.    Objective:   Physical Exam  Vital signs are reviewed GENERAL: Well-developed female no acute distress 10 SKIN: 2 areas on the right thigh some erythema, each is about quarter in size diameter. No induration. No lymph nodes noted in the groin. There is no generalized rash. HAND: Left fifth finger has a half centimeter irregular-appearing shallow laceration. There some surrounding erythema. There is no pus. It is nonfluctuant.      Assessment & Plan:  Tick bite as well as laceration from dog tooth. Think we'll go ahead and treat her with doxycycline on for a week. To followup urine

## 2014-08-10 ENCOUNTER — Ambulatory Visit (INDEPENDENT_AMBULATORY_CARE_PROVIDER_SITE_OTHER): Payer: Medicare (Managed Care) | Admitting: Family Medicine

## 2014-08-10 ENCOUNTER — Encounter: Payer: Self-pay | Admitting: Family Medicine

## 2014-08-10 VITALS — BP 120/70 | HR 64 | Temp 97.5°F | Ht 62.0 in | Wt 145.0 lb

## 2014-08-10 DIAGNOSIS — Z23 Encounter for immunization: Secondary | ICD-10-CM

## 2014-08-10 DIAGNOSIS — S81801A Unspecified open wound, right lower leg, initial encounter: Principal | ICD-10-CM

## 2014-08-10 DIAGNOSIS — S91001A Unspecified open wound, right ankle, initial encounter: Principal | ICD-10-CM

## 2014-08-10 DIAGNOSIS — S81001A Unspecified open wound, right knee, initial encounter: Secondary | ICD-10-CM

## 2014-08-10 DIAGNOSIS — S81009A Unspecified open wound, unspecified knee, initial encounter: Secondary | ICD-10-CM

## 2014-08-10 DIAGNOSIS — S91009A Unspecified open wound, unspecified ankle, initial encounter: Secondary | ICD-10-CM

## 2014-08-10 DIAGNOSIS — S81809A Unspecified open wound, unspecified lower leg, initial encounter: Secondary | ICD-10-CM

## 2014-08-10 MED ORDER — CEPHALEXIN 500 MG PO CAPS: 500.0000 mg | ORAL_CAPSULE | Freq: Two times a day (BID) | ORAL | Status: DC

## 2014-08-10 NOTE — Progress Notes (Signed)
   Subjective:    Patient ID: Margaret Barton, female    DOB: 20-Apr-1941, 73 y.o.   MRN: [id number]  HPI Bilateral extremity injuries. 3 days ago she was working in the garden, pulled up a tomato cage and scraped both lower shin areas. Mild bleeding at the time. This resolved. She's been putting antibiotic ointment on it but the area of redness has continued to increase. Last night she noted a lot of pain particularly in the left lower leg. Even the bed she was uncomfortable on it.  PERTINENT  PMH / PSH: I have reviewed the patient's medications, allergies, past medical and surgical history. Pertinent findings that relate to today's visit / issues include: Multiple skin issues including previous SCC, pyogenic granuloma, lots of solar damage. No personal Hx DM. Non smoker.  Review of Systems No fever, sweats, chills, unusual weight change.    Objective:   Physical Exam  Vital signs are reviewed GENERAL: Well-developed female no acute distress in SKIN: Left shin area reveals a 3 cm area of erythema surrounding a central area of skin tear that is centimeter wide by centimeter half long. There is a necrotic piece of skin flap centrally. There is no pus. There is mild swelling but no true induration surrounding. There is no increased warmth. Left shin has a similar but smaller area without any necrotic skin. Total diameter the area of erythema is about 2 and half centimeters.  NEURO/VASC/MSK: Other than these skin changes, lower extremity is are normal with intact sensation  To soft tounchand intact vascular status/ dorsalis pedis and posterior tibial pulses 2+B=. No joint tenderness ankle. No calf tenderness..      Assessment & Plan:  Skin lacerations. Will treat her with 7-10 days of Keflex and topical bacitracin. Her tetanus shot is 71 years old but this is technically an infected wound from regarding access I think it's best to go ahead and update that which we'll do today. She'll followup when  necessary let me know if this does not resolve.

## 2014-11-07 ENCOUNTER — Other Ambulatory Visit: Payer: Self-pay | Admitting: *Deleted

## 2014-11-09 MED ORDER — SIMVASTATIN 40 MG PO TABS: ORAL_TABLET | ORAL | Status: DC

## 2014-12-04 ENCOUNTER — Other Ambulatory Visit: Payer: Self-pay | Admitting: Family Medicine

## 2014-12-04 MED ORDER — OLOPATADINE HCL 0.1 % OP SOLN: OPHTHALMIC | Status: DC

## 2015-02-05 ENCOUNTER — Telehealth: Payer: Self-pay | Admitting: Family Medicine

## 2015-02-05 MED ORDER — TRAMADOL HCL 50 MG PO TABS: 50.0000 mg | ORAL_TABLET | Freq: Four times a day (QID) | ORAL | Status: DC | PRN

## 2015-02-05 NOTE — Telephone Encounter (Signed)
Left voice mail on pharmacy line; refill for Tramadol 50 mg #120, 4 refills, take 1 tab PO every 6 hours as needed per Dr. Sharyn Lull, RN

## 2015-02-05 NOTE — Telephone Encounter (Signed)
Margaret Barton Can u call this in for me  Tramadol for [facility]! Dorcas Mcmurray

## 2015-02-13 ENCOUNTER — Other Ambulatory Visit: Payer: Self-pay | Admitting: Family Medicine

## 2015-02-13 MED ORDER — HYDROCODONE-HOMATROPINE 5-1.5 MG/5ML PO SYRP: 5.0000 mL | ORAL_SOLUTION | Freq: Four times a day (QID) | ORAL | Status: DC | PRN

## 2015-02-13 MED ORDER — ACETAMINOPHEN-CODEINE 300-30 MG PO TABS: ORAL_TABLET | ORAL | Status: DC

## 2015-02-13 NOTE — Progress Notes (Signed)
Acetaminophen-Codeine 300-30 MG per tablet: #60 tablets, 0 refills: 1-2 tablets PO every eight hours as needed for cough called into [facility].  Pt informed that medication was called in.  Derl Barrow, RN

## 2015-02-13 NOTE — Progress Notes (Unsigned)
Margaret Barton

## 2015-04-13 ENCOUNTER — Encounter: Payer: Self-pay | Admitting: Family Medicine

## 2015-04-13 DIAGNOSIS — Z01419 Encounter for gynecological examination (general) (routine) without abnormal findings: Secondary | ICD-10-CM | POA: Insufficient documentation

## 2015-05-30 ENCOUNTER — Encounter: Payer: Medicare (Managed Care) | Admitting: Family Medicine

## 2015-06-06 ENCOUNTER — Encounter: Payer: Medicare (Managed Care) | Admitting: Family Medicine

## 2015-06-20 ENCOUNTER — Ambulatory Visit (INDEPENDENT_AMBULATORY_CARE_PROVIDER_SITE_OTHER): Payer: Medicare Other | Admitting: Family Medicine

## 2015-06-20 ENCOUNTER — Encounter: Payer: Self-pay | Admitting: Family Medicine

## 2015-06-20 VITALS — BP 133/44 | HR 55 | Temp 97.6°F | Ht 62.0 in | Wt 144.0 lb

## 2015-06-20 DIAGNOSIS — Z0000 Encounter for general adult medical examination without abnormal findings: Secondary | ICD-10-CM

## 2015-06-20 DIAGNOSIS — E785 Hyperlipidemia, unspecified: Secondary | ICD-10-CM | POA: Diagnosis not present

## 2015-06-20 DIAGNOSIS — Z658 Other specified problems related to psychosocial circumstances: Secondary | ICD-10-CM | POA: Diagnosis not present

## 2015-06-20 DIAGNOSIS — Z9189 Other specified personal risk factors, not elsewhere classified: Secondary | ICD-10-CM

## 2015-06-20 DIAGNOSIS — Z23 Encounter for immunization: Secondary | ICD-10-CM | POA: Diagnosis not present

## 2015-06-20 DIAGNOSIS — Z87898 Personal history of other specified conditions: Secondary | ICD-10-CM | POA: Diagnosis not present

## 2015-06-20 LAB — COMPREHENSIVE METABOLIC PANEL
ALT: 23 U/L (ref 0–35)
AST: 22 U/L (ref 0–37)
Albumin: 4.3 g/dL (ref 3.5–5.2)
Alkaline Phosphatase: 65 U/L (ref 39–117)
BILIRUBIN TOTAL: 0.6 mg/dL (ref 0.2–1.2)
BUN: 10 mg/dL (ref 6–23)
CALCIUM: 9.5 mg/dL (ref 8.4–10.5)
CO2: 27 mEq/L (ref 19–32)
CREATININE: 0.58 mg/dL (ref 0.50–1.10)
Chloride: 101 mEq/L (ref 96–112)
Glucose, Bld: 101 mg/dL — ABNORMAL HIGH (ref 70–99)
Potassium: 3.9 mEq/L (ref 3.5–5.3)
Sodium: 139 mEq/L (ref 135–145)
Total Protein: 6.8 g/dL (ref 6.0–8.3)

## 2015-06-20 LAB — LIPID PANEL
CHOLESTEROL: 173 mg/dL (ref 0–200)
HDL: 71 mg/dL (ref >=46)
LDL Cholesterol: 80 mg/dL (ref 0–99)
Total CHOL/HDL Ratio: 2.4 Ratio
Triglycerides: 112 mg/dL (ref <150)
VLDL: 22 mg/dL (ref 0–40)

## 2015-06-20 MED ORDER — TRAMADOL HCL 50 MG PO TABS: 50.0000 mg | ORAL_TABLET | Freq: Four times a day (QID) | ORAL | Status: DC | PRN

## 2015-06-20 MED ORDER — ACETAMINOPHEN-CODEINE 300-30 MG PO TABS: ORAL_TABLET | ORAL | Status: DC

## 2015-06-20 MED ORDER — CITALOPRAM HYDROBROMIDE 20 MG PO TABS: 20.0000 mg | ORAL_TABLET | Freq: Every day | ORAL | Status: DC

## 2015-06-20 MED ORDER — HYDROCODONE-HOMATROPINE 5-1.5 MG/5ML PO SYRP: 5.0000 mL | ORAL_SOLUTION | Freq: Four times a day (QID) | ORAL | Status: DC | PRN

## 2015-06-20 NOTE — Patient Instructions (Signed)
Restart the citalopram See me ioin 3-4 weeks Call if aything unusual I will send you a letter about your labs Great to see you---hang in there!

## 2015-06-21 LAB — VITAMIN D 25 HYDROXY (VIT D DEFICIENCY, FRACTURES): VIT D 25 HYDROXY: 27 ng/mL — AB (ref 30–100)

## 2015-06-22 NOTE — Progress Notes (Signed)
   Subjective:    Patient ID: Margaret Barton, female    DOB: Jul 09, 1941, 74 y.o.   MRN: [id number]  HPI Original is scheduled for checkup but mostly today she wants to discuss some new stressors. Having some mood lability, tearfulness related to some family stressors. No suicidal or homicidal ideation. Appetite is fine. Energy levels fine. A little bit nervous most of the time.   Review of Systems See history of present illness    Objective:   Physical Exam Vital signs reviewed. GENERAL: Well-developed, well-nourished, no acute distress. CARDIOVASCULAR: Regular rate and rhythm no murmur gallop or rub LUNGS: Clear to auscultation bilaterally, no rales or wheeze. ABDOMEN: Soft positive bowel sounds NEURO: No gross focal neurological deficits. MSK: Movement of extremity x 4.         Assessment & Plan:  #1. Also today I want to address her stressors. We will restart her citalopram and see her back in 2-3 weeks.

## 2015-06-27 ENCOUNTER — Encounter: Payer: Self-pay | Admitting: Family Medicine

## 2015-07-18 ENCOUNTER — Ambulatory Visit: Payer: Medicare (Managed Care) | Admitting: Family Medicine

## 2015-10-01 ENCOUNTER — Other Ambulatory Visit: Payer: Self-pay | Admitting: *Deleted

## 2015-10-01 MED ORDER — CITALOPRAM HYDROBROMIDE 20 MG PO TABS: 20.0000 mg | ORAL_TABLET | Freq: Every day | ORAL | Status: DC

## 2015-10-17 ENCOUNTER — Encounter: Payer: Self-pay | Admitting: Family Medicine

## 2015-10-24 ENCOUNTER — Other Ambulatory Visit: Payer: Self-pay | Admitting: *Deleted

## 2015-10-24 MED ORDER — SIMVASTATIN 40 MG PO TABS: ORAL_TABLET | ORAL | Status: DC

## 2015-11-06 ENCOUNTER — Encounter: Payer: Self-pay | Admitting: Sports Medicine

## 2015-11-06 ENCOUNTER — Ambulatory Visit (INDEPENDENT_AMBULATORY_CARE_PROVIDER_SITE_OTHER): Payer: Medicare Other | Admitting: *Deleted

## 2015-11-06 ENCOUNTER — Ambulatory Visit (INDEPENDENT_AMBULATORY_CARE_PROVIDER_SITE_OTHER): Payer: Medicare Other | Admitting: Sports Medicine

## 2015-11-06 VITALS — BP 138/76 | Ht 62.0 in | Wt 145.0 lb

## 2015-11-06 DIAGNOSIS — M1990 Unspecified osteoarthritis, unspecified site: Secondary | ICD-10-CM | POA: Diagnosis not present

## 2015-11-06 DIAGNOSIS — Z23 Encounter for immunization: Secondary | ICD-10-CM | POA: Diagnosis not present

## 2015-11-06 DIAGNOSIS — M129 Arthropathy, unspecified: Secondary | ICD-10-CM | POA: Diagnosis not present

## 2015-11-06 DIAGNOSIS — M1712 Unilateral primary osteoarthritis, left knee: Secondary | ICD-10-CM | POA: Insufficient documentation

## 2015-11-06 DIAGNOSIS — M19049 Primary osteoarthritis, unspecified hand: Secondary | ICD-10-CM

## 2015-11-06 MED ORDER — TRIAMCINOLONE ACETONIDE 10 MG/ML IJ SUSP
5.0000 mg | Freq: Once | INTRAMUSCULAR | Status: AC
  Administered 2015-11-06: 5 mg via INTRAMUSCULAR

## 2015-11-06 MED ORDER — METHYLPREDNISOLONE ACETATE 40 MG/ML IJ SUSP
40.0000 mg | Freq: Once | INTRAMUSCULAR | Status: AC
  Administered 2015-11-06: 40 mg via INTRA_ARTICULAR

## 2015-11-06 NOTE — Assessment & Plan Note (Signed)
For pain relief she has done best with corticosteroid injection  Procedure:  Injection of RT [facility] Consent obtained and verified. Time-out conducted. Noted no overlying erythema, induration, or other signs of local infection. Skin prepped in a sterile fashion. 1/2 mL Kenalog 10-1/2 mL of 1% Xylocaine were injected with a 30-gauge needle under ultrasound guidance Completed without difficulty.  I would hope this will give her several months of relief Ultimately she may have to have reconstructive surgery for this joint Meds: Pain immediately improved suggesting accurate placement of the medication. Advised to call if fevers/chills, erythema, induration, drainage, or persistent bleeding.

## 2015-11-06 NOTE — Progress Notes (Signed)
Patient ID: Margaret Barton, female   DOB: January 08, 1941, 74 y.o.   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 This is been x-rayed. She has had approximately 6-8 injections over the years mostly by Dr. Noemi Chapel. The last injection was more than 6 months ago. She has been active and the joint is swelling and very painful at this time. Over-the-counter medications such as Aleve and prescription tramadol helps somewhat but do not relieve the pain.  The second problem is left knee pain. She has some known mild arthritis of the left knee Now she is getting nighttime pain Swelling More stiffness in the mornings She denies mechanical symptoms of giving way or locking  Past history Osteopenia Hormone replacement therapy  Review of systems She has diffuse changes in the other joints in her hands including stiffness and deformity Pain in the fingers with too much activity No generalized joint swelling although she had a traumatic injury to the left wrist and sometimes gets wrist swelling both on the left and right  Physical examination No acute distress BP 138/76 mmHg  Ht 5\' 2"  (1.575 m)  Wt 145 lb (65.772 kg)  BMI 26.51 kg/m2  Right wrist There is some diffuse swelling and loss of motion Tenderness over the radiocarpal joint Tenderness over the [facility] There is spurring and marked deformity of the CMC joint MCP PIP and DIP joints all show significant arthritic change  Left knee There is a moderate effusion She lacks about 5 of full extension She lacks about 10-15 of full flexion compared to the RT Ligaments are stable Meniscus testing is unremarkable  Ultrasound Right wrist There is no fusion of the radiocarpal joint with a small bony fragment that appears to be a loose body The CMC joint shows marked arthritic changes and spurring There is essentially no joint space remaining  Left knee In the suprapatellar pouch there is a small  to moderate effusion that tracks laterally under the vastus lateralis tendon Lateral meniscus is. Well-preserved with some small spurs along the joint line Medial meniscus shows degenerative change Patellar tendon and quad tendon or okay On the lateral patella there is a large spur and there is some hypoechoic change

## 2015-11-06 NOTE — Assessment & Plan Note (Signed)
I continued her on her oral medications  Because of her significant pain we gave her a corticosteroid injection  Procedure:  Injection of Consent obtained and verified. Time-out conducted. Noted no overlying erythema, induration, or other signs of local infection. Skin prepped in a sterile fashion. Topical analgesic spray: Ethyl chloride. Completed without difficulty. Meds: 40 Milligrams Solu-Medrol and 4 mL lidoc Section down into the lateral suprapatellar pouch under ultrasound guidance without difficulty Pain immediately improved suggesting accurate placement of the medication. Advised to call if fevers/chills, erythema, induration, drainage, or persistent bleeding.  She may wish to start using a compression sleeve more if the swelling continues  Continue to use in NSAIDs or tramadol for pain

## 2015-11-14 DIAGNOSIS — S83249A Other tear of medial meniscus, current injury, unspecified knee, initial encounter: Secondary | ICD-10-CM | POA: Insufficient documentation

## 2016-03-17 ENCOUNTER — Other Ambulatory Visit: Payer: Self-pay | Admitting: Family Medicine

## 2016-03-17 MED ORDER — MONTELUKAST SODIUM 10 MG PO TABS: 10.0000 mg | ORAL_TABLET | Freq: Every day | ORAL | Status: DC

## 2016-05-07 ENCOUNTER — Other Ambulatory Visit: Payer: Self-pay | Admitting: Family Medicine

## 2016-05-07 MED ORDER — TRAMADOL HCL 50 MG PO TABS: 50.0000 mg | ORAL_TABLET | Freq: Four times a day (QID) | ORAL | Status: DC | PRN

## 2016-05-07 NOTE — Progress Notes (Signed)
Completed. Margaret Barton, Margaret Barton D, [place]

## 2016-05-07 NOTE — Progress Notes (Signed)
Dear Dema Severin Team Please call the tramadol  in w 5 refills THANKS! Dorcas Mcmurray

## 2016-06-25 ENCOUNTER — Encounter: Payer: Self-pay | Admitting: Family Medicine

## 2016-06-25 ENCOUNTER — Ambulatory Visit (INDEPENDENT_AMBULATORY_CARE_PROVIDER_SITE_OTHER): Payer: Medicare Other | Admitting: Family Medicine

## 2016-06-25 VITALS — BP 135/51 | HR 57 | Temp 97.7°F | Ht 62.0 in | Wt 143.8 lb

## 2016-06-25 DIAGNOSIS — Z79899 Other long term (current) drug therapy: Secondary | ICD-10-CM

## 2016-06-25 DIAGNOSIS — M5417 Radiculopathy, lumbosacral region: Secondary | ICD-10-CM

## 2016-06-25 DIAGNOSIS — Z0000 Encounter for general adult medical examination without abnormal findings: Secondary | ICD-10-CM

## 2016-06-25 DIAGNOSIS — E785 Hyperlipidemia, unspecified: Secondary | ICD-10-CM

## 2016-06-25 DIAGNOSIS — Z01419 Encounter for gynecological examination (general) (routine) without abnormal findings: Secondary | ICD-10-CM

## 2016-06-25 LAB — CBC
HCT: 42.1 % (ref 35.0–45.0)
Hemoglobin: 14.3 g/dL (ref 11.7–15.5)
MCH: 30.3 pg (ref 27.0–33.0)
MCHC: 34 g/dL (ref 32.0–36.0)
MCV: 89.2 fL (ref 80.0–100.0)
MPV: 9.9 fL (ref 7.5–12.5)
PLATELETS: 281 10*3/uL (ref 140–400)
RBC: 4.72 MIL/uL (ref 3.80–5.10)
RDW: 13.2 % (ref 11.0–15.0)
WBC: 6.2 10*3/uL (ref 3.8–10.8)

## 2016-06-25 LAB — COMPLETE METABOLIC PANEL WITH GFR
ALT: 21 U/L (ref 6–29)
AST: 22 U/L (ref 10–35)
Albumin: 4.5 g/dL (ref 3.6–5.1)
Alkaline Phosphatase: 73 U/L (ref 33–130)
BUN: 11 mg/dL (ref 7–25)
CHLORIDE: 103 mmol/L (ref 98–110)
CO2: 26 mmol/L (ref 20–31)
CREATININE: 0.68 mg/dL (ref 0.60–0.93)
Calcium: 9.6 mg/dL (ref 8.6–10.4)
GFR, Est African American: >89 mL/min (ref >=60)
GFR, Est Non African American: 88 mL/min (ref >=60)
GLUCOSE: 104 mg/dL — AB (ref 65–99)
POTASSIUM: 4.1 mmol/L (ref 3.5–5.3)
SODIUM: 138 mmol/L (ref 135–146)
Total Bilirubin: 0.5 mg/dL (ref 0.2–1.2)
Total Protein: 6.9 g/dL (ref 6.1–8.1)

## 2016-06-25 LAB — LIPID PANEL
CHOL/HDL RATIO: 2.7 ratio (ref <=5.0)
CHOLESTEROL: 210 mg/dL — AB (ref 125–200)
HDL: 78 mg/dL (ref >=46)
LDL CALC: 103 mg/dL (ref <130)
Triglycerides: 145 mg/dL (ref <150)
VLDL: 29 mg/dL (ref <30)

## 2016-06-25 MED ORDER — TRAMADOL HCL 50 MG PO TABS: ORAL_TABLET | ORAL | Status: DC

## 2016-06-25 NOTE — Patient Instructions (Signed)
Great to see you I will send you a note about your labs 

## 2016-06-26 NOTE — Progress Notes (Signed)
   Subjective:    Patient ID: Margaret Barton, female    DOB: [birth year], 75 y.o.   MRN: [id number]  HPI Here for checkup in CPE. Having some right hip and posterior thigh pain, occasionally radiates down to her sole of her foot. [place] acutely, the last several minutes or until she sits down. Pain is 9 out of 10. She started caring some tramadol with her because this hits her at the most inopportune times like when she's out shopping. She recently saw her orthopedist who evaluated her knee with her chronic meniscal tear.  Been followed by dermatology for skin solar damage and was recently placed on topical treatment for her chest. Is also used it on her face.   Review of Systems  Constitutional: Negative for fever, activity change, appetite change and unexpected weight change.  HENT: Negative for ear pain and hearing loss.   Eyes: Negative for pain and visual disturbance.  Respiratory: Negative for cough and shortness of breath.   Cardiovascular: Negative for chest pain, palpitations and leg swelling.  Genitourinary: Negative for dysuria and urgency.  Musculoskeletal: Positive for joint swelling and arthralgias.       Radicular pain in the right sciatic distribution, intermittent. Associated with some numbness and burning.  Neurological: Negative for dizziness, tremors, syncope, weakness and headaches.  Psychiatric/Behavioral: Negative for hallucinations, sleep disturbance, dysphoric mood and agitation. The patient is not nervous/anxious.        Objective:   Physical Exam Vital signs reviewed GENERALl: Well developed, well nourished, in no acute distress. NECK: Supple, FROM, without lymphadenopathy.  THYROID: normal without nodularity CAROTID ARTERIES: without bruits LUNGS: clear to auscultation bilaterally. No wheezes or rales. HEART: Regular rate and rhythm, no murmurs ABDOMEN: soft with positive bowel sounds MSK: MOE x 4 SKIN no rash. Multiple areas of actinic change  particularly on the anterior upper chest, bilateral hands, bilateral arms in sun exposed areas. Also has some milder actinic changes on her lower legs. VASCULAR: Dorsalis pedis and posterior tibial pulses, radial pulses 2+ bilaterally symmetrical. NEURO: no focal deficits        Assessment & Plan:  #1. Right-sided radiculopathy. Concerning for spinal stenosis or foraminal nerve root encroachment. I gave her the option of getting MRI here or having it done through her orthopedic office. She'll discussed with her orthopedist and let me know if we need to order that for her. I will increase her dose of tramadol as she's having to use more.

## 2016-06-26 NOTE — Assessment & Plan Note (Signed)
>>  ASSESSMENT AND PLAN FOR WELL WOMAN EXAM WRITTEN ON 06/26/2016 10:25 AM BY Kialee Kham L, MD  Her preventive care is updated. She continues to exercise and is very active. We'll check some lab work today. Follow-up one year and that she has some issues.

## 2016-06-26 NOTE — Assessment & Plan Note (Signed)
Her preventive care is updated. She continues to exercise and is very active. We'll check some lab work today. Follow-up one year and that she has some issues.

## 2016-07-01 ENCOUNTER — Encounter: Payer: Self-pay | Admitting: Family Medicine

## 2016-10-01 ENCOUNTER — Encounter: Payer: Self-pay | Admitting: Family Medicine

## 2016-10-01 ENCOUNTER — Ambulatory Visit (INDEPENDENT_AMBULATORY_CARE_PROVIDER_SITE_OTHER): Payer: Medicare Other | Admitting: Family Medicine

## 2016-10-01 VITALS — BP 134/56 | HR 56 | Temp 97.9°F | Ht 62.0 in | Wt 142.6 lb

## 2016-10-01 DIAGNOSIS — T148XXA Other injury of unspecified body region, initial encounter: Secondary | ICD-10-CM

## 2016-10-01 MED ORDER — CEPHALEXIN 500 MG PO CAPS: 500.0000 mg | ORAL_CAPSULE | Freq: Three times a day (TID) | ORAL | 0 refills | Status: AC

## 2016-10-01 NOTE — Progress Notes (Signed)
    CHIEF COMPLAINT / HPI:   Right leg abrasion--scraped it on a garden post. Seen at [facility]---placed on doxycycline. No fever. Has been getting better until day before yesterday when it became a little bit more painful and she knows some more erythema. She reports being up-to-date on her tetanus shot  REVIEW OF SYSTEMS:  See history of present illness  OBJECTIVE:  Vital signs are reviewed.   GEN.: Well-developed, well-nourished, no acute distress SKIN: Right lower extremity reveals a 2 and half centimeter long by 2 cm wide abrasion full-thickness, mild surrounding erythema. No evidence of drainage. There is some new granulation tissue apparent. EXTREMITY: The calf is nontender on the right lower extremity.  ASSESSMENT / PLAN: Skin abrasion with some superficial infection/inflammation. Pelvic there is any chance this was many acquired MRSA so I think that doxycycline on is not necessary. It is irritating her stomach and perhaps giving [place] has good the tissue concentration as cephalexin would so I will switch her. Also given her instructions on how to get some Tegaderm use for covering in the next 4-5 days. She reports her tetanus is within the last 3 years. She'll call me if it does not improve over the next week.

## 2016-11-05 ENCOUNTER — Other Ambulatory Visit: Payer: Self-pay | Admitting: *Deleted

## 2016-11-05 MED ORDER — SIMVASTATIN 40 MG PO TABS: ORAL_TABLET | ORAL | 3 refills | Status: DC

## 2017-01-01 ENCOUNTER — Ambulatory Visit (INDEPENDENT_AMBULATORY_CARE_PROVIDER_SITE_OTHER): Payer: Medicare Other | Admitting: Sports Medicine

## 2017-01-01 ENCOUNTER — Encounter: Payer: Self-pay | Admitting: Sports Medicine

## 2017-01-01 DIAGNOSIS — M5431 Sciatica, right side: Secondary | ICD-10-CM | POA: Insufficient documentation

## 2017-01-01 DIAGNOSIS — M19049 Primary osteoarthritis, unspecified hand: Secondary | ICD-10-CM | POA: Diagnosis not present

## 2017-01-01 MED ORDER — CLOBETASOL PROPIONATE 0.05 % EX CREA: TOPICAL_CREAM | Freq: Two times a day (BID) | CUTANEOUS | 0 refills | Status: DC

## 2017-01-01 MED ORDER — TRIAMCINOLONE ACETONIDE 10 MG/ML IJ SUSP
5.0000 mg | Freq: Once | INTRAMUSCULAR | Status: AC
  Administered 2017-01-01: 5 mg via INTRA_ARTICULAR

## 2017-01-01 MED ORDER — GABAPENTIN 300 MG PO CAPS: 300.0000 mg | ORAL_CAPSULE | Freq: Every day | ORAL | 2 refills | Status: DC

## 2017-01-01 NOTE — Assessment & Plan Note (Signed)
Procedure:  Injection of RT [facility] Consent obtained and verified. Time-out conducted. Noted no overlying erythema, induration, or other signs of local infection. Skin prepped in a sterile fashion. Topical analgesic spray: Ethyl chloride. Completed without difficulty.  Using US guidance we directed from dorsal aspect into joint space. Meds: 1/2 cc lidocaine 1% and 1/2 cc kenalog 10 Pain immediately improved suggesting accurate placement of the medication. Advised to call if fevers/chills, erythema, induration, drainage, or persistent bleeding.  We will do serial injections as needed for pain relief  Continue to do conservative care and warm water motion

## 2017-01-01 NOTE — Progress Notes (Signed)
Margaret Barton is a 76 y.o. female who is here for R sided sciatica and R wrist pain.     HPI:  Margaret Barton is an active 76yr old female who comes in for R sided sciatica since the Spring2017 as well as recurrence of R wrist pain. She reports that she is having intermittent sharp low back pain which radiates into the R buttocks and down the R leg.  Has been occurring more frequently in the last 2 months. Occurs now several days a week, worst in the morning, and usually after prolonged activity the day prior. For example, this morning she had sudden onset of this pain which made it difficult for her to get out of bed and walk down the stairs. Took a tramadol and applied heat and was able to move after 43minutes with some relief in pain. Says that she is now taking tramadol daily for her low back pain and these symptoms, but on some days requires 2-3 tramadol for more severe pain. Has also increased her use of arthotec to 2-3times daily.  As for her wrist, she has had good relief in the past for her arthritis with CMC injections. Last injection was 10/2015. Says that injections provide excellent relief of pain for at least 6 months, and then the pain gradually increases again. Increased pain with grasping and twisting motions such as opening a jar or popping a soda can tab. Reports accompanying loss of strength in her hand, but denies any numbness or tingling. Notices the joints have increased in size in both hands and most fingers, and has had to increase her ring size to fit over the PIP joint. Pain in finger joints, but none as badly as R CMC joint.  Med hx: R hip replacement, osteoarthritis, low back pain, SI joint pain, HLD  Meds: Simvastatin, tramadol, arthrotec  ROS No bowel or bladder changes Pain but no numbness into RT leg  Physical Exam:  BP 130/82   Ht 5\' 2"  (1.575 m)   Wt 143 lb (64.9 kg)   BMI 26.16 kg/m   Gen: NAD, sitting comfortably on exam table R wrist and hand: Mild swelling  with decreased flexion and extension vs. L. Bony hypertrophy and prominence of CMC joint. CMC TTP. Decreased ROM at [facility] joint. Thenar eminence atrophy. Arthritic changes in all PIP and DIP joints including subluxation of DIP joints, nodules on DIP joints, and enlargement of PIP joints. All arthritic changes are greater on R hand than on L.  Low back/hip: Loss of lumbar lordosis. TTP on lower lumbar vertebrae but not on paraspinal muscles. Decreased forward flexion of back (says she is usually able to touch her toes, but is > 55ft above ground today). Negative straight leg test. When standing straight, R leg has a flexural contracture in hamstrings. FABER- tight on R. Decreased internal rotation vs. L.  Pelvis/gait: Stiff SI joint R vs. L. When standing, R hemipelvis is dropped vs. L. Generally stiff pelvis with a twisting rotation with walking.  Ultrasound: There is advanced degenerative change of CMC joint right.  Small effusion.  Used to guide injection.  Assessment/Plan:  Margaret Barton is a delightful 76yr old who came in for evaluation of her R sided sciatica and R CMC arthritis.  1) R Sciatica- Si/sx of sciatic nerve irritation with compensatory changes, including flexural contraction of R leg and hamstring, altered gait, and stiffness. -Recommended multiple exercises to do daily: Standing hip rotation, knee to chest both straight and towards  opposite shoulder, and knees pulled to chest with rocking side-to-side. Encourage regular stretching to improve stiffness. -Start gabapentin 300mg  QHS. Reviewed possible risks and side effects, especially risk of sedation or change in mood. If significant side effects, discontinue medication. -May increase tramadol to BID, which will hopefully decrease her use of arthrotec  2) R CMC arthritis -CMC joint steroid injection was done today without complications   Follow-up: 6weeks   Thereasa Distance, MD  01/01/17   I observed and examined the patient with the  resident and agree with assessment and plan.  Note reviewed and modified by me. Stefanie Libel, MD

## 2017-01-01 NOTE — Assessment & Plan Note (Signed)
See OV note  Plan a trial on gabapentin and HEP

## 2017-02-17 ENCOUNTER — Encounter: Payer: Self-pay | Admitting: Sports Medicine

## 2017-02-17 ENCOUNTER — Ambulatory Visit (INDEPENDENT_AMBULATORY_CARE_PROVIDER_SITE_OTHER): Payer: Medicare Other | Admitting: Sports Medicine

## 2017-02-17 DIAGNOSIS — M5431 Sciatica, right side: Secondary | ICD-10-CM | POA: Diagnosis not present

## 2017-02-17 NOTE — Progress Notes (Signed)
Chief complaint - right sciatica  Patient states that since starting gabapentin at night her pain has dramatically decreased She no longer has any sciatic symptoms No burning pain and no numbness She is able to walk down the steps without pain She is back walking She is doing garden work None of these things have been particularly painful On days when she is done too much she has taken a second 300 mg gabapentin and has had relief  Right biceps She felt a pop in her arm working in the garden February 18 Since then the arm has look different No extensive bruising Not much pain  Review of systems No back pain Hip pain has resolved Stiffness is less No significant neck pain No radiculopathy into the right arm  Social history Retired Financial trader a large garden Often takes care of grandchildren  Physical examination Pleasant female in no acute distress BP 137/60   Pulse (!) 54   Ht 5\' 2"  (1.575 m)   Wt 142 lb (64.4 kg)   BMI 25.97 kg/m   Popeye deformity of right biceps No real tenderness No swelling Good strength  Right hip with good range of motion Able to do toe walk Heel walk Tandem walk No pain with these maneuvers and no real weakness  Some difficulty with 1 foot balance

## 2017-02-17 NOTE — Assessment & Plan Note (Signed)
There is significant improvement in her symptoms  While she has significant degenerative disc changes in the lumbar spine her exercises seem to be helping resolve the stiffness  We will continue gabapentin for the next 3 months Recheck at that time and see if we can start to wean the medication

## 2017-03-01 ENCOUNTER — Other Ambulatory Visit: Payer: Self-pay | Admitting: Family Medicine

## 2017-03-17 ENCOUNTER — Ambulatory Visit: Payer: Medicare Other | Admitting: Sports Medicine

## 2017-03-24 ENCOUNTER — Other Ambulatory Visit: Payer: Self-pay | Admitting: Sports Medicine

## 2017-03-24 ENCOUNTER — Other Ambulatory Visit: Payer: Self-pay

## 2017-04-23 LAB — HM DIABETES EYE EXAM

## 2017-06-19 ENCOUNTER — Other Ambulatory Visit: Payer: Self-pay | Admitting: Sports Medicine

## 2017-06-25 ENCOUNTER — Encounter: Payer: Self-pay | Admitting: Family Medicine

## 2017-07-01 ENCOUNTER — Ambulatory Visit (INDEPENDENT_AMBULATORY_CARE_PROVIDER_SITE_OTHER): Payer: Medicare Other | Admitting: Family Medicine

## 2017-07-01 ENCOUNTER — Encounter: Payer: Self-pay | Admitting: Family Medicine

## 2017-07-01 VITALS — BP 118/68 | HR 53 | Temp 97.6°F | Ht 62.0 in | Wt 143.0 lb

## 2017-07-01 DIAGNOSIS — M159 Polyosteoarthritis, unspecified: Secondary | ICD-10-CM

## 2017-07-01 DIAGNOSIS — E785 Hyperlipidemia, unspecified: Secondary | ICD-10-CM

## 2017-07-01 DIAGNOSIS — Z01419 Encounter for gynecological examination (general) (routine) without abnormal findings: Secondary | ICD-10-CM | POA: Diagnosis not present

## 2017-07-01 MED ORDER — DICLOFENAC-MISOPROSTOL 75-0.2 MG PO TBEC: DELAYED_RELEASE_TABLET | ORAL | 3 refills | Status: DC

## 2017-07-01 MED ORDER — SIMVASTATIN 40 MG PO TABS: ORAL_TABLET | ORAL | 3 refills | Status: DC

## 2017-07-01 MED ORDER — TRAMADOL HCL 50 MG PO TABS: ORAL_TABLET | ORAL | 5 refills | Status: DC

## 2017-07-01 NOTE — Assessment & Plan Note (Signed)
He has done well on simvastatin so we will continue that. Check lipid panel today and refills her medication.

## 2017-07-01 NOTE — Assessment & Plan Note (Signed)
She continues on hormone replacement currently followed by her gynecologist. Has had multiple DEXA scans in the past and is not went to consider any type of this phosphonate therapy so I don't see any reason to get on the shear.

## 2017-07-01 NOTE — Assessment & Plan Note (Signed)
She continues to be very active. We will refill her tramadol and her diclofenac/ misoprostyl . She's been on both of these for a while and they seem to really help her.

## 2017-07-01 NOTE — Assessment & Plan Note (Signed)
>>  ASSESSMENT AND PLAN FOR WELL WOMAN EXAM WRITTEN ON 07/01/2017  5:16 PM BY Shanaia Sievers L, MD  She continues on hormone replacement currently followed by her gynecologist. Has had multiple DEXA scans in the past and is not went to consider any type of this phosphonate therapy so I don't see any reason to get on the shear.

## 2017-07-01 NOTE — Progress Notes (Signed)
    CHIEF COMPLAINT / HPI:  Here for checkup. HEENT some refills. Just saw her dermatologist and had a skin lesion frozen. Overall she's doing pretty well. She had been taking some gabapentin at night for the radicular pain from her low back but has found that she can just about taper off that now. She plans to continue to taper off it totally. She is still using tramadol for her joint pains which continues to progress somewhat. She's taking her medicines rarely without any type of problem. She has questions about the new flu vaccine.  REVIEW OF SYSTEMS:  Review of Systems  Constitutional: Negative for activity chang; no  appetite change and no unexpected weight change.  Eyes: Negative for eye pain and no visual disturbance.  Neck: denies neck pain; no swallowing problems CV: No chest pain, no shortness of breath, no lower extremity edema. No change in exercise tolerance Respiratory: Negative for cough or wheezing.  No shortness of breath. Gastrointestinal: Negative for abdominal pain, no diarrhea and no  constipation.  Genitourinary: Negative for decreased urine volume and  no difficulty urinating.  Musculoskeletal: Negative for arthralgias. No muscle weakness. Skin: Negative for rash.  Psychiatric/Behavioral: Negative for behavioral problems; no sleep disturbance and no  agitation.     OBJECTIVE:  Vital signs reviewed GENERALl: Well developed, well nourished, in no acute distress. HEENT: PERRLA, EOMI, sclerae are nonicteric NECK: Supple, FROM, without lymphadenopathy.  THYROID: normal without nodularity CAROTID ARTERIES: without bruits LUNGS: clear to auscultation bilaterally. No wheezes or rales. Normal respiratory effort HEART: Regular rate and rhythm, no murmurs. Distal pulses are bilaterally symmetrical, 2+. ABDOMEN: soft with positive bowel sounds. No masses noted MSK: MOE x 4. Normal muscle strength, bulk and tone. SKIN no rash. Normal temperature. Multiple areas of solar  change, actinic change. NEURO: no focal deficits. Normal gait. Normal balance. PSYCHIATRIC: Alert and oriented 4. Affects interactive. Recent remote memory is intact. Judgment is normal. Speech is normal and fluency in content.   ASSESSMENT / PLAN: Please see problem oriented charting for details

## 2017-07-02 LAB — CBC
Hematocrit: 41.1 % (ref 34.0–46.6)
Hemoglobin: 13.8 g/dL (ref 11.1–15.9)
MCH: 30.4 pg (ref 26.6–33.0)
MCHC: 33.6 g/dL (ref 31.5–35.7)
MCV: 91 fL (ref 79–97)
PLATELETS: 259 10*3/uL (ref 150–379)
RBC: 4.54 x10E6/uL (ref 3.77–5.28)
RDW: 13.8 % (ref 12.3–15.4)
WBC: 6.1 10*3/uL (ref 3.4–10.8)

## 2017-07-02 LAB — LIPID PANEL
CHOL/HDL RATIO: 2.5 ratio (ref 0.0–4.4)
Cholesterol, Total: 178 mg/dL (ref 100–199)
HDL: 70 mg/dL (ref >39)
LDL CALC: 86 mg/dL (ref 0–99)
Triglycerides: 112 mg/dL (ref 0–149)
VLDL CHOLESTEROL CAL: 22 mg/dL (ref 5–40)

## 2017-07-02 LAB — COMPREHENSIVE METABOLIC PANEL
ALT: 27 IU/L (ref 0–32)
AST: 26 IU/L (ref 0–40)
Albumin/Globulin Ratio: 1.9 (ref 1.2–2.2)
Albumin: 4.3 g/dL (ref 3.5–4.8)
Alkaline Phosphatase: 73 IU/L (ref 39–117)
BUN/Creatinine Ratio: 20 (ref 12–28)
BUN: 12 mg/dL (ref 8–27)
Bilirubin Total: 0.3 mg/dL (ref 0.0–1.2)
CALCIUM: 9.4 mg/dL (ref 8.7–10.3)
CO2: 23 mmol/L (ref 20–29)
CREATININE: 0.61 mg/dL (ref 0.57–1.00)
Chloride: 103 mmol/L (ref 96–106)
GFR, EST AFRICAN AMERICAN: 104 mL/min/{1.73_m2} (ref >59)
GFR, EST NON AFRICAN AMERICAN: 90 mL/min/{1.73_m2} (ref >59)
GLUCOSE: 94 mg/dL (ref 65–99)
Globulin, Total: 2.3 g/dL (ref 1.5–4.5)
Potassium: 4.7 mmol/L (ref 3.5–5.2)
Sodium: 141 mmol/L (ref 134–144)
TOTAL PROTEIN: 6.6 g/dL (ref 6.0–8.5)

## 2017-07-05 ENCOUNTER — Encounter: Payer: Self-pay | Admitting: Family Medicine

## 2017-07-27 ENCOUNTER — Other Ambulatory Visit: Payer: Self-pay | Admitting: Sports Medicine

## 2018-01-28 ENCOUNTER — Encounter: Payer: Self-pay | Admitting: Family Medicine

## 2018-02-03 ENCOUNTER — Other Ambulatory Visit: Payer: Self-pay | Admitting: *Deleted

## 2018-02-03 MED ORDER — TRAMADOL HCL 50 MG PO TABS: ORAL_TABLET | ORAL | 5 refills | Status: DC

## 2018-03-01 ENCOUNTER — Other Ambulatory Visit: Payer: Self-pay | Admitting: *Deleted

## 2018-03-01 MED ORDER — GABAPENTIN 300 MG PO CAPS: 300.0000 mg | ORAL_CAPSULE | Freq: Every day | ORAL | 0 refills | Status: DC

## 2018-03-06 ENCOUNTER — Other Ambulatory Visit: Payer: Self-pay | Admitting: Family Medicine

## 2018-03-28 ENCOUNTER — Other Ambulatory Visit: Payer: Self-pay | Admitting: Sports Medicine

## 2018-04-02 ENCOUNTER — Encounter: Payer: Self-pay | Admitting: Family Medicine

## 2018-04-02 ENCOUNTER — Ambulatory Visit (INDEPENDENT_AMBULATORY_CARE_PROVIDER_SITE_OTHER): Payer: Medicare Other | Admitting: Family Medicine

## 2018-04-02 VITALS — BP 114/72 | Ht 62.0 in | Wt 140.0 lb

## 2018-04-02 DIAGNOSIS — J069 Acute upper respiratory infection, unspecified: Secondary | ICD-10-CM

## 2018-04-02 MED ORDER — AZITHROMYCIN 250 MG PO TABS: ORAL_TABLET | ORAL | 0 refills | Status: DC

## 2018-04-02 MED ORDER — ACETAMINOPHEN-CODEINE #3 300-30 MG PO TABS: 1.0000 | ORAL_TABLET | ORAL | 0 refills | Status: DC | PRN

## 2018-04-02 NOTE — Progress Notes (Signed)
    CHIEF COMPLAINT / HPI: 3 days of worsening cough.  Keeping her up at night.  Lots of sinus pain and drainage.  Had some fever last night with chills.  No sweats.  Cough productive of some scant sputum.  Pressure behind her eyes and above her teeth with some facial pain.  No problems swallowing  REVIEW OF SYSTEMS: See HPI  PERTINENT  PMH / PSH: I have reviewed the patient's medications, allergies, past medical and surgical history, smoking status and updated in the EMR as appropriate.   OBJECTIVE: Vital signs reviewed. GENERAL: Well-developed, well-nourished, no acute distress. CARDIOVASCULAR: Regular rate and rhythm no murmur gallop or rub LUNGS: Clear to auscultation bilaterally, no rales or wheeze. ABDOMEN: Soft positive bowel sounds NECK: No lymphadenopathy, no thyromegaly. ENT: TMs with good landmarks.  Oropharynx with some mild erythema but no exudate. Tender to palpation across left greater than right maxillary sinus area.   ASSESSMENT / PLAN: Acute rhiosinusitis with cough.  Azithromycin and acetaminophen with codeine for cough Expect 7-10 days to resolve but should not get worse and she will call me if she does

## 2018-04-27 ENCOUNTER — Other Ambulatory Visit: Payer: Self-pay | Admitting: Sports Medicine

## 2018-05-20 ENCOUNTER — Encounter: Payer: Self-pay | Admitting: Sports Medicine

## 2018-05-20 ENCOUNTER — Ambulatory Visit (INDEPENDENT_AMBULATORY_CARE_PROVIDER_SITE_OTHER): Payer: Medicare Other | Admitting: Sports Medicine

## 2018-05-20 VITALS — BP 124/82 | Ht 62.0 in | Wt 140.0 lb

## 2018-05-20 DIAGNOSIS — M1712 Unilateral primary osteoarthritis, left knee: Secondary | ICD-10-CM

## 2018-05-20 DIAGNOSIS — G8929 Other chronic pain: Secondary | ICD-10-CM | POA: Diagnosis not present

## 2018-05-20 DIAGNOSIS — M25562 Pain in left knee: Secondary | ICD-10-CM | POA: Diagnosis not present

## 2018-05-20 MED ORDER — METHYLPREDNISOLONE ACETATE 40 MG/ML IJ SUSP
40.0000 mg | Freq: Once | INTRAMUSCULAR | Status: AC
  Administered 2018-05-20: 40 mg via INTRA_ARTICULAR

## 2018-05-20 NOTE — Progress Notes (Signed)
   Subjective:    Patient ID: Margaret Barton, female    DOB: 01-12-1941, 77 y.o.   MRN: [id number]  HPI  Chief complaint: Left knee pain  Margaret Barton presents today with medial sided left knee pain. She has a known history of knee osteoarthritis. She is leaving for a trip to [place] in a couple of weeks and is requesting a cortisone injection. In addition to pain, she is also getting swelling.Symptoms are worse with activity.She's had a cortisone in the past but it was quite a while ago. She denies any recent trauma.  Interim medical history reviewed Medications reviewed Allergies reviewed    Review of Systems    as above Objective:   Physical Exam  Well-developed, well-nourished. No acute distress. Awake alert and oriented 3. Vital signs reviewed  Left knee: Patient lacks about 5 of extension. Flexion is to 120. 1+ boggy synovitis. Diffuse tenderness to palpation along the medial knee with bony hypertrophy suggestive of DJD. Knee is grossly stable to ligamentous exam. Neurovascularly she is intact distally.      Assessment & Plan:   Left knee pain and swelling secondary to DJD  Patient's left knee is injected with cortisone today. An anterior medial approach was utilized. Patient tolerated this without difficulty. Follow-up for ongoing or recalcitrant issues.  Consent obtained and verified. Time-out conducted. Noted no overlying erythema, induration, or other signs of local infection. Skin prepped in a sterile fashion. Topical analgesic spray: Ethyl chloride. Joint: left knee Needle: 25g 1.5 inch Completed without difficulty. Meds: 3cc 1% xylocaine, 1cc (40mg ) depomedrol  Advised to call if fevers/chills, erythema, induration, drainage, or persistent bleeding.

## 2018-05-26 ENCOUNTER — Ambulatory Visit (INDEPENDENT_AMBULATORY_CARE_PROVIDER_SITE_OTHER): Payer: Medicare Other | Admitting: Sports Medicine

## 2018-05-26 ENCOUNTER — Encounter: Payer: Self-pay | Admitting: Sports Medicine

## 2018-05-26 DIAGNOSIS — M19049 Primary osteoarthritis, unspecified hand: Secondary | ICD-10-CM

## 2018-05-26 MED ORDER — TRIAMCINOLONE ACETONIDE 10 MG/ML IJ SUSP
5.0000 mg | Freq: Once | INTRAMUSCULAR | Status: AC
Start: 2018-05-26 — End: 2018-05-26
  Administered 2018-05-26: 5 mg via INTRA_ARTICULAR

## 2018-05-26 NOTE — Assessment & Plan Note (Signed)
For CSI today  She will monitor result  We can do periodic injection  Will ultimately need surgical correction

## 2018-05-26 NOTE — Progress Notes (Signed)
RT [facility] joint pain  Here for RT [facility] joint injection Known end stage OA We have been doing periodic injection since referral from orthopedics  Procedure Procedure:  Injection of RT [facility] Consent obtained and verified. Time-out conducted. Noted no overlying erythema, induration, or other signs of local infection. Skin prepped in a sterile fashion. Topical analgesic spray: Ethyl chloride. Completed without difficulty.  I used [place] to identify joint space on dorsum and guide injection.  Joint shows advanced breakdown and calcifications. Carpal bone had subluxed. Meds: 1/2 cc kenalog 10mg  and 1/2 cc lidocaine 1% Pain immediately improved suggesting accurate placement of the medication. Advised to call if fevers/chills, erythema, induration, drainage, or persistent bleeding.

## 2018-05-27 ENCOUNTER — Other Ambulatory Visit: Payer: Self-pay | Admitting: Sports Medicine

## 2018-05-27 ENCOUNTER — Ambulatory Visit: Payer: Medicare Other | Admitting: Sports Medicine

## 2018-05-31 ENCOUNTER — Other Ambulatory Visit: Payer: Self-pay

## 2018-05-31 MED ORDER — AMOXICILLIN 500 MG PO TABS: 500.0000 mg | ORAL_TABLET | Freq: Two times a day (BID) | ORAL | 0 refills | Status: DC

## 2018-06-25 ENCOUNTER — Other Ambulatory Visit: Payer: Self-pay | Admitting: Sports Medicine

## 2018-07-07 ENCOUNTER — Other Ambulatory Visit: Payer: Self-pay

## 2018-07-07 ENCOUNTER — Ambulatory Visit (INDEPENDENT_AMBULATORY_CARE_PROVIDER_SITE_OTHER): Payer: Medicare Other | Admitting: Family Medicine

## 2018-07-07 ENCOUNTER — Encounter: Payer: Self-pay | Admitting: Family Medicine

## 2018-07-07 VITALS — BP 142/68 | HR 48 | Temp 97.7°F | Ht 62.0 in | Wt 139.0 lb

## 2018-07-07 DIAGNOSIS — Z01419 Encounter for gynecological examination (general) (routine) without abnormal findings: Secondary | ICD-10-CM | POA: Diagnosis not present

## 2018-07-07 DIAGNOSIS — E785 Hyperlipidemia, unspecified: Secondary | ICD-10-CM

## 2018-07-07 DIAGNOSIS — M19049 Primary osteoarthritis, unspecified hand: Secondary | ICD-10-CM

## 2018-07-07 DIAGNOSIS — Z79899 Other long term (current) drug therapy: Secondary | ICD-10-CM

## 2018-07-07 DIAGNOSIS — Z79890 Hormone replacement therapy (postmenopausal): Secondary | ICD-10-CM

## 2018-07-08 ENCOUNTER — Encounter: Payer: Self-pay | Admitting: Family Medicine

## 2018-07-08 LAB — LIPID PANEL
CHOLESTEROL TOTAL: 191 mg/dL (ref 100–199)
Chol/HDL Ratio: 2.5 ratio (ref 0.0–4.4)
HDL: 76 mg/dL (ref >39)
LDL Calculated: 91 mg/dL (ref 0–99)
TRIGLYCERIDES: 122 mg/dL (ref 0–149)
VLDL Cholesterol Cal: 24 mg/dL (ref 5–40)

## 2018-07-08 LAB — CMP14+EGFR
ALBUMIN: 4.5 g/dL (ref 3.5–4.8)
ALT: 24 IU/L (ref 0–32)
AST: 26 IU/L (ref 0–40)
Albumin/Globulin Ratio: 2 (ref 1.2–2.2)
Alkaline Phosphatase: 69 IU/L (ref 39–117)
BUN/Creatinine Ratio: 15 (ref 12–28)
BUN: 10 mg/dL (ref 8–27)
Bilirubin Total: 0.5 mg/dL (ref 0.0–1.2)
CALCIUM: 9.4 mg/dL (ref 8.7–10.3)
CO2: 23 mmol/L (ref 20–29)
CREATININE: 0.68 mg/dL (ref 0.57–1.00)
Chloride: 102 mmol/L (ref 96–106)
GFR calc Af Amer: 100 mL/min/{1.73_m2} (ref >59)
GFR, EST NON AFRICAN AMERICAN: 86 mL/min/{1.73_m2} (ref >59)
GLOBULIN, TOTAL: 2.2 g/dL (ref 1.5–4.5)
Glucose: 93 mg/dL (ref 65–99)
Potassium: 4.3 mmol/L (ref 3.5–5.2)
Sodium: 140 mmol/L (ref 134–144)
Total Protein: 6.7 g/dL (ref 6.0–8.5)

## 2018-07-08 LAB — VITAMIN D 25 HYDROXY (VIT D DEFICIENCY, FRACTURES): Vit D, 25-Hydroxy: 23.4 ng/mL — ABNORMAL LOW (ref 30.0–100.0)

## 2018-07-08 NOTE — Progress Notes (Signed)
    CHIEF COMPLAINT / HPI: Here for checkup.  Needs some refills.  REVIEW OF SYSTEMS: Review of Systems  Constitutional: Negative for activity chang; no  appetite change and no unexpected weight change.  Eyes: Negative for eye pain and no visual disturbance.  Neck: denies neck pain; no swallowing problems CV: No chest pain, no shortness of breath, no lower extremity edema. No change in exercise tolerance Respiratory: Negative for cough or wheezing.  No shortness of breath. Gastrointestinal: Negative for abdominal pain, no diarrhea and no  constipation.  Genitourinary: Negative for decreased urine volume and  no difficulty urinating.  Musculoskeletal: Negative for arthralgias. No muscle weakness. Skin: Negative for rash.  Psychiatric/Behavioral: Negative for behavioral problems; no sleep disturbance and no  agitation.     PERTINENT  PMH / PSH: I have reviewed the patient's medications, allergies, past medical and surgical history, smoking status and updated in the EMR as appropriate.   OBJECTIVE: Vital signs reviewed GENERALl: Well developed, well nourished, in no acute distress. HEENT: PERRLA, EOMI, sclerae are nonicteric NECK: Supple, FROM, without lymphadenopathy.  THYROID: normal without nodularity CAROTID ARTERIES: without bruits LUNGS: clear to auscultation bilaterally. No wheezes or rales. Normal respiratory effort HEART: Regular rate and rhythm, no murmurs. Distal pulses are bilaterally symmetrical, 2+. ABDOMEN: soft with positive bowel sounds. No masses noted MSK: MOE x 4. Normal muscle strength, bulk and tone. SKIN no rash.  Multiple skin changes consistent with long-term sun damage.  Note: She is followed by dermatologist.  Normal temperature. NEURO: no focal deficits. Normal gait. Normal balance. Psychiatric: Alert and oriented x4 affect is interactive.  Speech is normal.  Recent and remote memory is intact.  Judgment is intact   ASSESSMENT / PLAN:  Well woman  exam Up-to-date on all health maintenance.  Quite active.  Is followed by dermatologist twice a year for her extensive solar skin damage  Hyperlipidemia Check labs Continue current meds  [facility] arthritis We discussed.  She is really having significant deformity of her right thumb.  Might be time for her to see the hand surgeon again and she agrees to set that up.  Hormone replacement therapy (postmenopausal) Continue estradiol

## 2018-07-08 NOTE — Assessment & Plan Note (Signed)
Up-to-date on all health maintenance.  Quite active.  Is followed by dermatologist twice a year for her extensive solar skin damage

## 2018-07-08 NOTE — Assessment & Plan Note (Signed)
Continue estradiol 

## 2018-07-08 NOTE — Assessment & Plan Note (Signed)
>>  ASSESSMENT AND PLAN FOR WELL WOMAN EXAM WRITTEN ON 07/08/2018 11:00 AM BY Brittni Hult L, MD  Up-to-date on all health maintenance.  Quite active.  Is followed by dermatologist twice a year for her extensive solar skin damage

## 2018-07-08 NOTE — Assessment & Plan Note (Signed)
We discussed.  She is really having significant deformity of her right thumb.  Might be time for her to see the hand surgeon again and she agrees to set that up.

## 2018-07-08 NOTE — Assessment & Plan Note (Signed)
Check labs.  Continue current meds.

## 2018-07-26 ENCOUNTER — Other Ambulatory Visit: Payer: Self-pay | Admitting: Sports Medicine

## 2018-08-10 ENCOUNTER — Other Ambulatory Visit: Payer: Self-pay | Admitting: Family Medicine

## 2018-08-10 MED ORDER — TRAMADOL HCL 50 MG PO TABS: ORAL_TABLET | ORAL | 5 refills | Status: DC

## 2018-08-10 MED ORDER — DICLOFENAC-MISOPROSTOL 75-0.2 MG PO TBEC: DELAYED_RELEASE_TABLET | ORAL | 3 refills | Status: DC

## 2018-08-26 ENCOUNTER — Other Ambulatory Visit: Payer: Self-pay | Admitting: Sports Medicine

## 2018-09-22 ENCOUNTER — Other Ambulatory Visit: Payer: Self-pay | Admitting: Sports Medicine

## 2018-10-21 ENCOUNTER — Other Ambulatory Visit: Payer: Self-pay | Admitting: Sports Medicine

## 2018-11-03 ENCOUNTER — Other Ambulatory Visit: Payer: Self-pay | Admitting: Family Medicine

## 2018-11-03 MED ORDER — SIMVASTATIN 40 MG PO TABS: ORAL_TABLET | ORAL | 3 refills | Status: DC

## 2019-01-18 ENCOUNTER — Other Ambulatory Visit: Payer: Self-pay | Admitting: Family Medicine

## 2019-01-18 MED ORDER — OSELTAMIVIR PHOSPHATE 75 MG PO CAPS: 75.0000 mg | ORAL_CAPSULE | Freq: Every day | ORAL | 0 refills | Status: DC

## 2019-01-18 NOTE — Progress Notes (Signed)
o

## 2019-06-14 ENCOUNTER — Ambulatory Visit (INDEPENDENT_AMBULATORY_CARE_PROVIDER_SITE_OTHER): Payer: Medicare Other | Admitting: Family Medicine

## 2019-06-14 ENCOUNTER — Ambulatory Visit: Payer: Self-pay

## 2019-06-14 ENCOUNTER — Other Ambulatory Visit: Payer: Self-pay

## 2019-06-14 VITALS — BP 138/60 | Ht 62.0 in | Wt 136.0 lb

## 2019-06-14 DIAGNOSIS — M79604 Pain in right leg: Secondary | ICD-10-CM | POA: Diagnosis not present

## 2019-06-14 DIAGNOSIS — M25561 Pain in right knee: Secondary | ICD-10-CM

## 2019-06-14 DIAGNOSIS — M25562 Pain in left knee: Secondary | ICD-10-CM | POA: Diagnosis not present

## 2019-06-14 MED ORDER — SULFAMETHOXAZOLE-TRIMETHOPRIM 800-160 MG PO TABS: 1.0000 | ORAL_TABLET | Freq: Two times a day (BID) | ORAL | 0 refills | Status: DC

## 2019-06-14 NOTE — Patient Instructions (Signed)
Take bactrim 1 tab twice a day for 7 days for your right leg as a precaution - keep me updated and I need to know asap if you get fevers, chills, the redness is spreading in this area. Your knee pain is due to arthritis - moderate in medial and patellofemoral compartments, mild laterally with two large loose bodies and a baker's cyst. These are the different medications you can take for this: Tylenol 500mg  1-2 tabs three times a day for pain. Capsaicin, aspercreme, or biofreeze topically up to four times a day may also help with pain. Some supplements that may help for arthritis: Boswellia extract, curcumin, pycnogenol Diclofenac as you have been. Cortisone injections are an option but I'd wait until your right leg heals more before we go ahead with this. If cortisone injections do not help, there are different types of shots that may help but they take longer to take effect. It's important that you continue to stay active. Straight leg raises, knee extensions 3 sets of 10 once a day (add ankle weight if these become too easy). Consider physical therapy to strengthen muscles around the joint that hurts to take pressure off of the joint itself. Shoe inserts with good arch support may be helpful. Ice 15 minutes at a time 3-4 times a day as needed to help with pain. Compression sleeve or ACE wrap to help with the swelling when up and around. Water aerobics and cycling with low resistance are the best two types of exercise for arthritis though any exercise is ok as long as it doesn't worsen the pain. Follow up with me in 1-2 weeks.

## 2019-06-15 ENCOUNTER — Encounter: Payer: Self-pay | Admitting: Family Medicine

## 2019-06-15 NOTE — Progress Notes (Addendum)
PCP: Dickie La, MD  Subjective:   HPI: Patient is a 78 y.o. female here for left knee pain, right shin pain.  Patient has known history of left knee arthritis with loose bodies seen on radiographs from 11-17-17, worst in patellofemoral compartment where it is moderate, mild medially. Pain is anterior and medial, been present for several years. Currently mild but can be more severe and sharp. More swelling recently.  No other skin changes here. 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 While this location is healing she's bumped her shin and has two areas of tenderness, swelling and breaks in skin distolateral to the removal site. No fevers, chills, sweats. No streaking of area. Very tender to touch.  History reviewed. No pertinent past medical history.  Current Outpatient Medications on File Prior to Visit  Medication Sig Dispense Refill  . amoxicillin (AMOXIL) 500 MG tablet Take 1 tablet (500 mg total) by mouth 2 (two) times daily. 20 tablet 0  . Diclofenac-miSOPROStol 75-0.2 MG TBEC Take one by mouth twice a day prn pain 180 tablet 3  . estradiol (ESTRADERM) 0.1 MG/24HR Place 1 patch onto the skin twice a week.      Marland Kitchen FLUCELVAX QUADRIVALENT 0.5 ML SUSY inject 0.5 milliliter intramuscularly  0  . fluorouracil (EFUDEX) 5 % cream APP TOPICALLY AA QD MON THROUGH FRI FOR 6 WKS    . gabapentin (NEURONTIN) 300 MG capsule TAKE ONE CAPSULE BY MOUTH AT BEDTIME 30 capsule 0  . montelukast (SINGULAIR) 10 MG tablet TAKE 1 TABLET(10 MG) BY MOUTH DAILY 90 tablet 3  . simvastatin (ZOCOR) 40 MG tablet TAKE 1 TABLET (40 MG TOTAL) BY MOUTH AT BEDTIME. 90 tablet 3  . traMADol (ULTRAM) 50 MG tablet Take one or two tablets three times a day as needed for pain max of 6 tabs in 24 hours 180 tablet 5   No current facility-administered medications on file prior to visit.     Past Surgical History:  Procedure Laterality Date  . ABDOMINAL HYSTERECTOMY    . TOTAL HIP  ARTHROPLASTY     Right    No Known Allergies  Social History   Socioeconomic History  . Marital status: Married    Spouse name: Clair Gulling  . Number of children: 2  . Years of education: Not on file  . Highest education level: Not on file  Occupational History  . Occupation: Professor    Fish farm manager: [employer]  . Occupation: CELL  [phone]  Social Needs  . Financial resource strain: Not on file  . Food insecurity    Worry: Not on file    Inability: Not on file  . Transportation needs    Medical: Not on file    Non-medical: Not on file  Tobacco Use  . Smoking status: Never Smoker  . Smokeless tobacco: Never Used  Substance and Sexual Activity  . Alcohol use: Yes    Alcohol/week: 4.0 standard drinks    Types: 4 drink(s) per week  . Drug use: No  . Sexual activity: Not on file  Lifestyle  . Physical activity    Days per week: Not on file    Minutes per session: Not on file  . Stress: Not on file  Relationships  . Social Herbalist on phone: Not on file    Gets together: Not on file    Attends religious service: Not on file    Active member of club or organization: Not  on file    Attends meetings of clubs or organizations: Not on file    Relationship status: Not on file  . Intimate partner violence    Fear of current or ex partner: Not on file    Emotionally abused: Not on file    Physically abused: Not on file    Forced sexual activity: Not on file  Other Topics Concern  . Not on file  Social History Narrative   Health Care POA: Husband, Joelyn Lover   Emergency Contact: Dr. Dorcas Mcmurray   End of Life Plan:    Who lives with you: Lives with husband on 35 acres of property   Any pets: 1 german shepard, 3 cats   Diet: Patient has a varied diet of protein, starch, and fruits and vegetables   Exercise: Patient exercises 4-5 times a week.   Seatbelts: Patient reports wearing seat belt when in vehicle.   Nancy Fetter Exposure/Protection: Patient reports wear sun  screen daily.   Hobbies: Gardening, sports, teaching, cooking             Family History  Problem Relation Age of Onset  . Diabetes Mother   . COPD Father        smoker    BP 138/60   Ht 5\' 2"  (1.575 m)   Wt 136 lb (61.7 kg)   BMI 24.87 kg/m   Review of Systems: See HPI above.     Objective:  Physical Exam:  Gen: NAD, comfortable in exam room  Left knee: Mod effusion.  No bruising, other deformity.  Palpable baker's cyst. TTP medial joint line > post patellar facets FROM with 5/5 strength flexion and extension. Negative ant/post drawers. Negative valgus/varus testing. Negative lachmans. Negative mcmurrays, apleys, patellar apprehension. NV intact distally.  Right knee/shin: Biopsy site appears to be well healing with granulation tissue at site, no surrounding erythema.  Two tender areas about 1 cm in diameter with break in skin, healing well without any drainage; crusted over.  Small surrounding redness of each without streaking. FROM with 5/5 strength knee. Tenderness in areas noted above.  Minimal medial joint line tenderness. NVI distally.   MSK u/s left knee: Moderate effusion noted with two large loose bodies in suprapatellar pouch measuring 1.34cm and 2.04cm in largest diameters.  Moderate medial arthropathy, minimal lateral arthropathy.  Patellar and quad tendons appear normal.  Moderate sized bakers cyst present as well.  Assessment & Plan:  1. Left knee pain - consistent with moderate arthritis with two large loose bodies, effusion, baker's cyst.  Discussed tylenol, topical medications.  Supplements reviewed via instructions.  Consider steroid injection but advised we wait until areas on her right leg heal because of concern for possible infection there.  Compression, icing, elevation.  2. Right lower leg pain/rash - areas consistent with small hematomas underlying superficial lacerations that appear to be healing.  Redness surrounding this does not appears  cellulitis but advised with her tenderness, history of hip replacement we should be cautious and cover with antibiotics for possible developing infection.  She does not tolerate keflex unfortunately.  Will start bactrim ds bid x 7 days.  Consider addition of clindamycin or increase to 2 bid if she's not improving.  Advised her to keep [place] updated on her status, follow up if not improving, develops fever, chills, sweats, redness spreads.  Addendum:  Patient felt about the same so was instructed to increase bactrim to 2 tabs bid - sent more in to complete a 10 day  course.  She noted improvement after a day on increased dosage.  Will continue to monitor.

## 2019-06-17 MED ORDER — SULFAMETHOXAZOLE-TRIMETHOPRIM 800-160 MG PO TABS: 2.0000 | ORAL_TABLET | Freq: Two times a day (BID) | ORAL | 0 refills | Status: DC

## 2019-06-17 NOTE — Addendum Note (Signed)
Addended by: Dene Gentry on: 06/17/2019 08:48 AM   Modules accepted: Orders

## 2019-07-13 ENCOUNTER — Encounter: Payer: Self-pay | Admitting: Family Medicine

## 2019-07-13 ENCOUNTER — Ambulatory Visit (INDEPENDENT_AMBULATORY_CARE_PROVIDER_SITE_OTHER): Payer: Medicare Other | Admitting: Family Medicine

## 2019-07-13 ENCOUNTER — Other Ambulatory Visit: Payer: Self-pay

## 2019-07-13 VITALS — BP 132/60 | HR 52 | Wt 140.4 lb

## 2019-07-13 DIAGNOSIS — E785 Hyperlipidemia, unspecified: Secondary | ICD-10-CM

## 2019-07-13 DIAGNOSIS — Z01419 Encounter for gynecological examination (general) (routine) without abnormal findings: Secondary | ICD-10-CM | POA: Diagnosis not present

## 2019-07-13 NOTE — Patient Instructions (Signed)
Great to see you!   

## 2019-07-14 LAB — COMPREHENSIVE METABOLIC PANEL
ALT: 27 IU/L (ref 0–32)
AST: 24 IU/L (ref 0–40)
Albumin/Globulin Ratio: 2.3 — ABNORMAL HIGH (ref 1.2–2.2)
Albumin: 4.6 g/dL (ref 3.7–4.7)
Alkaline Phosphatase: 71 IU/L (ref 39–117)
BUN/Creatinine Ratio: 14 (ref 12–28)
BUN: 9 mg/dL (ref 8–27)
Bilirubin Total: 0.3 mg/dL (ref 0.0–1.2)
CO2: 25 mmol/L (ref 20–29)
Calcium: 9.4 mg/dL (ref 8.7–10.3)
Chloride: 104 mmol/L (ref 96–106)
Creatinine, Ser: 0.65 mg/dL (ref 0.57–1.00)
GFR calc Af Amer: 100 mL/min/{1.73_m2} (ref >59)
GFR calc non Af Amer: 87 mL/min/{1.73_m2} (ref >59)
Globulin, Total: 2 g/dL (ref 1.5–4.5)
Glucose: 97 mg/dL (ref 65–99)
Potassium: 4.7 mmol/L (ref 3.5–5.2)
Sodium: 144 mmol/L (ref 134–144)
Total Protein: 6.6 g/dL (ref 6.0–8.5)

## 2019-07-14 LAB — LIPID PANEL
Chol/HDL Ratio: 2.6 ratio (ref 0.0–4.4)
Cholesterol, Total: 178 mg/dL (ref 100–199)
HDL: 69 mg/dL (ref >39)
LDL Calculated: 84 mg/dL (ref 0–99)
Triglycerides: 126 mg/dL (ref 0–149)
VLDL Cholesterol Cal: 25 mg/dL (ref 5–40)

## 2019-07-16 MED ORDER — OSELTAMIVIR PHOSPHATE 75 MG PO CAPS: 75.0000 mg | ORAL_CAPSULE | Freq: Every day | ORAL | 1 refills | Status: DC

## 2019-07-16 NOTE — Progress Notes (Signed)
    CHIEF COMPLAINT / HPI:  CPE No new issues thub pain and other arthritis, esp left knee pain is still problematic  REVIEW OF SYSTEMS: Review of Systems  Constitutional: Negative for activity chang; no  appetite change and no unexpected weight change.  Eyes: Negative for eye pain and no visual disturbance.  Neck: denies neck pain; no swallowing problems CV: No chest pain, no shortness of breath, no lower extremity edema. No change in exercise tolerance Respiratory: Negative for cough or wheezing.  No shortness of breath. Gastrointestinal: Negative for abdominal pain, no diarrhea and no  constipation.  Genitourinary: Negative for decreased urine volume and  no difficulty urinating.   No muscle weakness. Skin: Negative for rash.  Psychiatric/Behavioral: Negative for behavioral problems; no sleep disturbance and no  agitation.     PERTINENT  PMH / PSH: I have reviewed the patient's medications, allergies, past medical and surgical history, smoking status and updated in the EMR as appropriate.   OBJECTIVE:  Vital signs reviewed GENERALl: Well developed, well nourished, in no acute distress. HEENT: PERRLA, EOMI, sclerae are nonicteric NECK: Supple, FROM, without lymphadenopathy.  THYROID: normal without nodularity CAROTID ARTERIES: without bruits LUNGS: clear to auscultation bilaterally. No wheezes or rales. Normal respiratory effort HEART: Regular rate and rhythm, no murmurs. Distal pulses are bilaterally symmetrical, 2+. ABDOMEN: soft with positive bowel sounds. No masses noted MSK: MOE x 4. Normal muscle strength, bulk and tone Left knee synovial hypertrophy and stiffness on extension. SKIN no rash. Normal temperature. NEURO: no focal deficits. Normal gait. Normal balance.   ASSESSMENT / PLAN:   No problem-specific Assessment & Plan notes found for this encounter.

## 2019-07-16 NOTE — Assessment & Plan Note (Signed)
UTD on health maintenance Labs She requests rx of tamiflu to have on hand Refills on chronic meds

## 2019-07-16 NOTE — Assessment & Plan Note (Signed)
>>  ASSESSMENT AND PLAN FOR WELL WOMAN EXAM WRITTEN ON 07/16/2019  6:17 PM BY Duante Arocho L, MD  UTD on health maintenance Labs She requests rx of tamiflu  to have on hand Refills on chronic meds

## 2019-07-18 ENCOUNTER — Other Ambulatory Visit: Payer: Self-pay | Admitting: Family Medicine

## 2019-07-25 ENCOUNTER — Encounter: Payer: Self-pay | Admitting: Family Medicine

## 2019-10-18 ENCOUNTER — Telehealth: Payer: Self-pay | Admitting: *Deleted

## 2019-10-18 ENCOUNTER — Other Ambulatory Visit: Payer: Self-pay | Admitting: *Deleted

## 2019-10-18 MED ORDER — SIMVASTATIN 40 MG PO TABS: ORAL_TABLET | ORAL | 3 refills | Status: DC

## 2019-10-18 MED ORDER — MONTELUKAST SODIUM 10 MG PO TABS: 10.0000 mg | ORAL_TABLET | Freq: Every day | ORAL | 3 refills | Status: DC

## 2019-10-18 NOTE — Telephone Encounter (Signed)
Received refill request on montelukast from [facility].  Not on current med list.  Christen Bame, CMA

## 2019-12-26 ENCOUNTER — Telehealth (INDEPENDENT_AMBULATORY_CARE_PROVIDER_SITE_OTHER): Payer: Medicare PPO | Admitting: Family Medicine

## 2019-12-26 VITALS — Ht 62.0 in | Wt 138.0 lb

## 2019-12-26 DIAGNOSIS — S8992XA Unspecified injury of left lower leg, initial encounter: Secondary | ICD-10-CM

## 2019-12-26 MED ORDER — SULFAMETHOXAZOLE-TRIMETHOPRIM 800-160 MG PO TABS: 2.0000 | ORAL_TABLET | Freq: Two times a day (BID) | ORAL | 0 refills | Status: DC

## 2019-12-27 ENCOUNTER — Encounter: Payer: Self-pay | Admitting: Family Medicine

## 2019-12-27 NOTE — Progress Notes (Signed)
Virtual Visit via Video Note  I connected with SHELBIA WAHLERT on 12/27/19 at  4:00 PM EST by a video enabled telemedicine application and verified that I am speaking with the correct person using two identifiers.  Location: Patient: home Provider: office   I discussed the limitations of evaluation and management by telemedicine and the availability of in person appointments. The patient expressed understanding and agreed to proceed.  History of Present Illness: 79 y/o F presents via video visit with left lower leg pain.  Patient reports about 3 weeks ago she accidentally kicked the corner of a footstool with her left lower leg. Caused large abrasion of this area with significant surrounding bruising, swelling. She has been icing, elevating. Compression is too uncomfortable to do. Area is more warm now with possible redness though difficult to note with the bruising. Has history of cellulitis. No fever.   Observations/Objective: Gen: NAD, comfortable.  Left lower leg: Large crusted area over abrasion with significant bruising surrounding this.  No visible drainage or purulence.  Difficult to discern erythema with amount of bruising.  Assessment and Plan: Left lower leg abrasion - appears to be healing albeit slowly.  Concern about possible cellulitis - has done well with bactrim in past - will prescribe 2 ds tabs bid x 7 days, let us know how she's doing over the next week.    I discussed the assessment and treatment plan with the patient. The patient was provided an opportunity to ask questions and all were answered. The patient agreed with the plan and demonstrated an understanding of the instructions.   The patient was advised to call back or seek an in-person evaluation if the symptoms worsen or if the condition fails to improve as anticipated.  I provided 12 minutes of non-face-to-face time during this encounter.   Karlton Lemon, MD

## 2019-12-29 DIAGNOSIS — H25813 Combined forms of age-related cataract, bilateral: Secondary | ICD-10-CM | POA: Diagnosis not present

## 2020-02-13 DIAGNOSIS — Z01419 Encounter for gynecological examination (general) (routine) without abnormal findings: Secondary | ICD-10-CM | POA: Diagnosis not present

## 2020-04-09 ENCOUNTER — Encounter: Payer: Self-pay | Admitting: Family Medicine

## 2020-04-09 ENCOUNTER — Ambulatory Visit (INDEPENDENT_AMBULATORY_CARE_PROVIDER_SITE_OTHER): Payer: Medicare PPO | Admitting: Family Medicine

## 2020-04-09 ENCOUNTER — Other Ambulatory Visit: Payer: Self-pay

## 2020-04-09 VITALS — BP 142/64 | Ht 62.0 in | Wt 137.0 lb

## 2020-04-09 DIAGNOSIS — L03116 Cellulitis of left lower limb: Secondary | ICD-10-CM

## 2020-04-09 MED ORDER — SULFAMETHOXAZOLE-TRIMETHOPRIM 800-160 MG PO TABS: 2.0000 | ORAL_TABLET | Freq: Two times a day (BID) | ORAL | 0 refills | Status: DC

## 2020-04-09 NOTE — Patient Instructions (Signed)
Take bactrim 2 tabs twice a day for 10 days - finish it even if you feel better - keep me updated and I need to know asap if you get fevers, chills, the redness is spreading in this area.

## 2020-04-09 NOTE — Progress Notes (Signed)
PCP: Dickie La, MD  Subjective:   HPI: Patient is a 79 y.o. female here for left leg injury.  Patient reports on Thursday afternoon her son's dog jumped on her left lower leg causing break in the skin. Increased redness around the area, warmth, and tenderness. Warmth has improved as she took 2g amoxicillin she had at home. No streaking, fevers, chills, sweats.  History reviewed. No pertinent past medical history.  Current Outpatient Medications on File Prior to Visit  Medication Sig Dispense Refill  . amoxicillin (AMOXIL) 500 MG tablet Take 1 tablet (500 mg total) by mouth 2 (two) times daily. 20 tablet 0  . Diclofenac-miSOPROStol 75-0.2 MG TBEC TAKE 1 TABLET BY MOUTH TWICE DAILY AS NEEDED FOR PAIN 180 tablet 3  . estradiol (ESTRADERM) 0.1 MG/24HR Place 1 patch onto the skin twice a week.      Marland Kitchen FLUCELVAX QUADRIVALENT 0.5 ML SUSY inject 0.5 milliliter intramuscularly  0  . fluorouracil (EFUDEX) 5 % cream APP TOPICALLY AA QD MON THROUGH FRI FOR 6 WKS    . montelukast (SINGULAIR) 10 MG tablet Take 1 tablet (10 mg total) by mouth at bedtime. 90 tablet 3  . oseltamivir (TAMIFLU) 75 MG capsule Take 1 capsule (75 mg total) by mouth daily. 7 capsule 1  . simvastatin (ZOCOR) 40 MG tablet TAKE 1 TABLET (40 MG TOTAL) BY MOUTH AT BEDTIME. 90 tablet 3   No current facility-administered medications on file prior to visit.    Past Surgical History:  Procedure Laterality Date  . ABDOMINAL HYSTERECTOMY    . TOTAL HIP ARTHROPLASTY     Right    No Known Allergies  Social History   Socioeconomic History  . Marital status: Married    Spouse name: Clair Gulling  . Number of children: 2  . Years of education: Not on file  . Highest education level: Not on file  Occupational History  . Occupation: Professor    Fish farm manager: [employer]  . Occupation: CELL  [phone]  Tobacco Use  . Smoking status: Never Smoker  . Smokeless tobacco: Never Used  Substance and Sexual Activity  . Alcohol use:  Yes    Alcohol/week: 4.0 standard drinks    Types: 4 drink(s) per week  . Drug use: No  . Sexual activity: Not on file  Other Topics Concern  . Not on file  Social History Narrative   Health Care POA: Husband, Sheneta Ruan   Emergency Contact: Dr. Dorcas Mcmurray   End of Life Plan:    Who lives with you: Lives with husband on 36 acres of property   Any pets: 1 german shepard, 3 cats   Diet: Patient has a varied diet of protein, starch, and fruits and vegetables   Exercise: Patient exercises 4-5 times a week.   Seatbelts: Patient reports wearing seat belt when in vehicle.   Nancy Fetter Exposure/Protection: Patient reports wear sun screen daily.   Hobbies: Gardening, sports, teaching, cooking            Social Determinants of Health   Financial Resource Strain:   . Difficulty of Paying Living Expenses:   Food Insecurity:   . Worried About Charity fundraiser in the Last Year:   . Arboriculturist in the Last Year:   Transportation Needs:   . Film/video editor (Medical):   Marland Kitchen Lack of Transportation (Non-Medical):   Physical Activity:   . Days of Exercise per Week:   . Minutes of Exercise per Session:  Stress:   . Feeling of Stress :   Social Connections:   . Frequency of Communication with Friends and Family:   . Frequency of Social Gatherings with Friends and Family:   . Attends Religious Services:   . Active Member of Clubs or Organizations:   . Attends Archivist Meetings:   Marland Kitchen Marital Status:   Intimate Partner Violence:   . Fear of Current or Ex-Partner:   . Emotionally Abused:   Marland Kitchen Physically Abused:   . Sexually Abused:     Family History  Problem Relation Age of Onset  . Diabetes Mother   . COPD Father        smoker    BP (!) 142/64   Ht 5\' 2"  (1.575 m)   Wt 137 lb (62.1 kg)   BMI 25.06 kg/m   Review of Systems: See HPI above.     Objective:  Physical Exam:  Gen: NAD, comfortable in exam room  Left lower leg: Superficial open wound about 3 x  2cm in diameter with second skin overlying this. Mild surrounding erythema and minimal warmth without streaking or purulence. No necrotic areas noted. NVI distally with excellent dp pulses and cap refill.   Assessment & Plan:  1. Left lower leg wound - concerning for mild surrounding cellulitis.  Has done well with bactrim ds 2 tabs bid in the past - will prescribe again for 10 days.  Does not tolerate keflex.  Advised to let us know if she has any fevers, chills, sweats, or redness spreading from area.

## 2020-04-27 ENCOUNTER — Emergency Department (HOSPITAL_BASED_OUTPATIENT_CLINIC_OR_DEPARTMENT_OTHER)
Admission: EM | Admit: 2020-04-27 | Discharge: 2020-04-27 | Disposition: A | Discharge status: Home / Self Care | Payer: Medicare PPO | Attending: Emergency Medicine | Admitting: Emergency Medicine

## 2020-04-27 ENCOUNTER — Other Ambulatory Visit: Payer: Self-pay | Admitting: Sports Medicine

## 2020-04-27 ENCOUNTER — Other Ambulatory Visit: Payer: Self-pay

## 2020-04-27 ENCOUNTER — Emergency Department (HOSPITAL_BASED_OUTPATIENT_CLINIC_OR_DEPARTMENT_OTHER): Payer: Medicare PPO

## 2020-04-27 ENCOUNTER — Encounter (HOSPITAL_BASED_OUTPATIENT_CLINIC_OR_DEPARTMENT_OTHER): Payer: Self-pay

## 2020-04-27 DIAGNOSIS — R109 Unspecified abdominal pain: Secondary | ICD-10-CM | POA: Diagnosis not present

## 2020-04-27 DIAGNOSIS — Z96641 Presence of right artificial hip joint: Secondary | ICD-10-CM | POA: Diagnosis not present

## 2020-04-27 DIAGNOSIS — Z85828 Personal history of other malignant neoplasm of skin: Secondary | ICD-10-CM | POA: Insufficient documentation

## 2020-04-27 LAB — COMPREHENSIVE METABOLIC PANEL
ALT: 29 U/L (ref 0–44)
AST: 28 U/L (ref 15–41)
Albumin: 4.6 g/dL (ref 3.5–5.0)
Alkaline Phosphatase: 69 U/L (ref 38–126)
Anion gap: 10 (ref 5–15)
BUN: 10 mg/dL (ref 8–23)
CO2: 27 mmol/L (ref 22–32)
Calcium: 9.2 mg/dL (ref 8.9–10.3)
Chloride: 102 mmol/L (ref 98–111)
Creatinine, Ser: 0.71 mg/dL (ref 0.44–1.00)
GFR calc Af Amer: >60 mL/min (ref >60)
GFR calc non Af Amer: >60 mL/min (ref >60)
Glucose, Bld: 110 mg/dL — ABNORMAL HIGH (ref 70–99)
Potassium: 3.6 mmol/L (ref 3.5–5.1)
Sodium: 139 mmol/L (ref 135–145)
Total Bilirubin: 0.7 mg/dL (ref 0.3–1.2)
Total Protein: 7.6 g/dL (ref 6.5–8.1)

## 2020-04-27 LAB — CBC WITH DIFFERENTIAL/PLATELET
Abs Immature Granulocytes: 0.03 10*3/uL (ref 0.00–0.07)
Basophils Absolute: 0.1 10*3/uL (ref 0.0–0.1)
Basophils Relative: 1 %
Eosinophils Absolute: 0.1 10*3/uL (ref 0.0–0.5)
Eosinophils Relative: 1 %
HCT: 45.7 % (ref 36.0–46.0)
Hemoglobin: 14.9 g/dL (ref 12.0–15.0)
Immature Granulocytes: 0 %
Lymphocytes Relative: 15 %
Lymphs Abs: 1.4 10*3/uL (ref 0.7–4.0)
MCH: 30.4 pg (ref 26.0–34.0)
MCHC: 32.6 g/dL (ref 30.0–36.0)
MCV: 93.3 fL (ref 80.0–100.0)
Monocytes Absolute: 0.9 10*3/uL (ref 0.1–1.0)
Monocytes Relative: 10 %
Neutro Abs: 6.6 10*3/uL (ref 1.7–7.7)
Neutrophils Relative %: 73 %
Platelets: 262 10*3/uL (ref 150–400)
RBC: 4.9 MIL/uL (ref 3.87–5.11)
RDW: 12.7 % (ref 11.5–15.5)
WBC: 9 10*3/uL (ref 4.0–10.5)
nRBC: 0 % (ref 0.0–0.2)

## 2020-04-27 LAB — URINALYSIS, ROUTINE W REFLEX MICROSCOPIC
Bilirubin Urine: NEGATIVE
Glucose, UA: NEGATIVE mg/dL
Hgb urine dipstick: NEGATIVE
Ketones, ur: NEGATIVE mg/dL
Leukocytes,Ua: NEGATIVE
Nitrite: NEGATIVE
Protein, ur: NEGATIVE mg/dL
Specific Gravity, Urine: <1.005 — ABNORMAL LOW (ref 1.005–1.030)
pH: 6 (ref 5.0–8.0)

## 2020-04-27 MED ORDER — TRAMADOL HCL 50 MG PO TABS: 50.0000 mg | ORAL_TABLET | Freq: Four times a day (QID) | ORAL | 0 refills | Status: DC | PRN

## 2020-04-27 MED ORDER — LIDOCAINE 5 % EX OINT
1.0000 | TOPICAL_OINTMENT | Freq: Three times a day (TID) | CUTANEOUS | 0 refills | Status: DC | PRN
Start: 2020-04-27 — End: 2024-03-17

## 2020-04-27 MED ORDER — TRAMADOL HCL 50 MG PO TABS
50.0000 mg | ORAL_TABLET | Freq: Once | ORAL | Status: AC
  Administered 2020-04-27: 50 mg via ORAL
  Filled 2020-04-27: qty 1

## 2020-04-27 MED ORDER — CYCLOBENZAPRINE HCL 10 MG PO TABS
10.0000 mg | ORAL_TABLET | Freq: Every evening | ORAL | 0 refills | Status: DC | PRN
Start: 2020-04-27 — End: 2021-05-28

## 2020-04-27 NOTE — Discharge Instructions (Signed)
You were seen in the emergency department today with left flank pain.  Your CT scan and lab work are reassuring.  Your CT scan did show some degenerative disc disease in the spine area which could be contributing to your discomfort.  It is also possible that you pulled a muscle while working in the garden yesterday.  I have prescribed some topical lidocaine which you can alternate with Voltaren which you have at home.  I have prescribed tramadol for severe pain but note that this can cause drowsiness and should not be taken if you plan on driving a car and should not be taken with any alcohol or other sedating medications.  Please call your primary care doctor today and schedule a 1 week follow-up appointment to ensure that your symptoms have resolved.   If you develop sudden worsening pain in your abdomen or flank, leg discomfort, lower back pain, numbness in the legs, or weakness you should return to the ED for evaluation.

## 2020-04-27 NOTE — ED Triage Notes (Signed)
Pt arrives with c/o left flank pain starting last night reports pain with bending and turning. Denies any history of kidney stones, denies urinary symptoms, denies injury.

## 2020-04-27 NOTE — ED Provider Notes (Signed)
Emergency Department Provider Note   I have reviewed the triage vital signs and the nursing notes.   HISTORY  Chief Complaint Flank Pain   HPI Margaret Barton is a 79 y.o. female with past medical history of degenerative disc disease, hyperlipidemia, and arthritis presents to the emergency department for evaluation of left flank pain starting last night.  Patient describes pain in the left flank radiating around to the lateral abdomen.  Symptoms began at 9 PM yesterday.  She describes minimal pain at rest but more severe pain with any movement.  She is not experiencing any midline back pain.  No numbness in the groin.  No pain or weakness shooting into the legs.  No numbness in the legs.  No fevers.  She denies any dysuria, hesitancy, urgency.  No change in urine color.  No vomiting or diarrhea. No pain in the front of the abdomen. No other modifying factors. Patient notes that she was out working in the garden yesterday and digging but denies any obvious pain during this.   History reviewed. No pertinent past medical history.  Patient Active Problem List   Diagnosis Date Noted   Degenerative joint disease involving multiple joints on both sides of body 07/01/2017   Arthritis of knee, left 11/06/2015   Well woman exam 04/13/2015   Hormone replacement therapy (postmenopausal) 01/26/2014   Osteoporosis/osteopenia increased risk 01/25/2014   Encounter for Arihaan Bellucci-term (current) use of high-risk medication 11/25/2012   Well adult exam 10/08/2011   ALLERGIC RHINITIS, SEASONAL 02/13/2009   CARCINOMA, SKIN, SQUAMOUS CELL 07/04/2008   Actinic keratosis 05/30/2008   Hyperlipidemia 02/18/2007   ALLERGIC CONJUNCTIVITIS 02/18/2007   CMC arthritis 02/18/2007    Past Surgical History:  Procedure Laterality Date   ABDOMINAL HYSTERECTOMY     TOTAL HIP ARTHROPLASTY     Right   WRIST SURGERY Right     Allergies Keflex [cephalexin]  Family History  Problem Relation Age of  Onset   Diabetes Mother    COPD Father        smoker    Social History Social History   Tobacco Use   Smoking status: Never Smoker   Smokeless tobacco: Never Used  Substance Use Topics   Alcohol use: Yes    Alcohol/week: 4.0 standard drinks    Types: 4 drink(s) per week   Drug use: No    Review of Systems  Constitutional: No fever/chills Eyes: No visual changes. ENT: No sore throat. Cardiovascular: Denies chest pain. Respiratory: Denies shortness of breath. Gastrointestinal: No abdominal pain but positive left flank pain.  No nausea, no vomiting.  No diarrhea.  No constipation. Genitourinary: Negative for dysuria. Musculoskeletal: Positive left flank pain.  Skin: Negative for rash. Neurological: Negative for headaches, focal weakness or numbness.  10-point ROS otherwise negative.  ____________________________________________   PHYSICAL EXAM:  VITAL SIGNS: ED Triage Vitals  Enc Vitals Group     BP 04/27/20 1024 (!) 153/71     Pulse Rate 04/27/20 1024 65     Resp 04/27/20 1024 18     Temp 04/27/20 1024 97.9 F (36.6 C)     Temp src --      SpO2 04/27/20 1024 99 %     Weight 04/27/20 1020 137 lb (62.1 kg)     Height 04/27/20 1020 5\' 2"  (1.575 m)   Constitutional: Alert and oriented. Well appearing and in no acute distress. Eyes: Conjunctivae are normal.  Head: Atraumatic. Nose: No congestion/rhinnorhea. Mouth/Throat: Mucous membranes are moist.  Neck: No stridor.   Cardiovascular: Normal rate, regular rhythm. Good peripheral circulation. Grossly normal heart sounds.   Respiratory: Normal respiratory effort.  No retractions. Lungs CTAB. Gastrointestinal: Soft and non-tender in all quadrants. No distention.  Musculoskeletal: No midline thoracic or lumbar spine tenderness.  Patient does have focal tenderness over the left flank and lateral abdomen which reproduces her discomfort.  Neurologic:  Normal speech and language. No gross focal neurologic deficits  are appreciated.  Skin:  Skin is warm, dry and intact. No rash noted.  ____________________________________________   LABS (all labs ordered are listed, but only abnormal results are displayed)  Labs Reviewed  COMPREHENSIVE METABOLIC PANEL - Abnormal; Notable for the following components:      Result Value   Glucose, Bld 110 (*)    All other components within normal limits  URINALYSIS, ROUTINE W REFLEX MICROSCOPIC - Abnormal; Notable for the following components:   Specific Gravity, Urine <1.005 (*)    All other components within normal limits  URINE CULTURE  CBC WITH DIFFERENTIAL/PLATELET   ____________________________________________  RADIOLOGY  CT Renal Stone Study  Result Date: 04/27/2020 CLINICAL DATA:  Left flank pain for 1 day. EXAM: CT ABDOMEN AND PELVIS WITHOUT CONTRAST TECHNIQUE: Multidetector CT imaging of the abdomen and pelvis was performed following the standard protocol without IV contrast. COMPARISON:  None. FINDINGS: Lower chest: Lung bases clear. Heart size normal. Calcific coronary atherosclerosis noted. Hepatobiliary: No focal liver abnormality is seen. No gallstones, gallbladder wall thickening, or biliary dilatation. Pancreas: Unremarkable. No pancreatic ductal dilatation or surrounding inflammatory changes. Spleen: Normal in size without focal abnormality. Adrenals/Urinary Tract: The adrenal glands appear normal. There are no renal or ureteral stones. No hydronephrosis on the right or left. 1.2 cm hyperattenuating focus in the lower pole of the left kidney is likely a complex cyst. Urinary bladder appears normal. Stomach/Bowel: Stomach is within normal limits. Appendix appears normal. Scratch the appendix is not visualized but no evidence of appendicitis is seen. No evidence of bowel wall thickening, distention, or inflammatory changes. Vascular/Lymphatic: Aortic atherosclerosis. No enlarged abdominal or pelvic lymph nodes. Reproductive: Status post hysterectomy. No  adnexal masses. Other: Very small fat containing umbilical hernia noted. Musculoskeletal: The patient has a unilateral pars interarticularis defect on the left at L5 and advanced facet arthropathy on the right. There is approximately 1 cm anterolisthesis L5 on S1. Degenerative disc disease L3-4, L4-5 and L5-S1 is seen. Status post right hip replacement. IMPRESSION: Negative for urinary tract stone. No acute abnormality or finding to explain the patient's symptoms. Aortic Atherosclerosis (ICD10-I70.0). Calcific coronary artery disease also noted. Lower lumbar spondylosis as described. Electronically Signed   By: Inge Rise M.D.   On: 04/27/2020 11:13    ____________________________________________   PROCEDURES  Procedure(s) performed:   Procedures  None  ____________________________________________   INITIAL IMPRESSION / ASSESSMENT AND PLAN / ED COURSE  Pertinent labs & imaging results that were available during my care of the patient were reviewed by me and considered in my medical decision making (see chart for details).   Patient presents to the emergency department for evaluation of left flank pain which began last night.  Symptoms are worse with movement and reproducible on my exam.  My suspicion for aortic pathology is very low.  Lower suspicion clinically for diverticulitis, pancreatitis, or other intra-abdominal process.  Kidney stones are consideration although patient does not have this history and symptoms are reproducible.  Plan for CT renal along with screening blood work and UA given the patient's  age and new presentation of severe pain.   11:40 AM  CT renal along with blood work reviewed.  No acute findings.  Patient does have some degenerative disc disease in the spine along with aortic atherosclerosis.  No aortic dilation.  Pain is reproducible on exam and I do not feel that further vascular imaging is warranted in the emergency setting.  Will refill the patient's  prescription for tramadol and provide lidocaine ointment prescription.  She has Voltaren at home.  Discussed close PCP follow-up in the coming week along with ED return precautions.  Patient's husband to drive her home.  ____________________________________________  FINAL CLINICAL IMPRESSION(S) / ED DIAGNOSES  Final diagnoses:  Left flank pain     MEDICATIONS GIVEN DURING THIS VISIT:  Medications  traMADol (ULTRAM) tablet 50 mg (50 mg Oral Given 04/27/20 1042)     NEW OUTPATIENT MEDICATIONS STARTED DURING THIS VISIT:  New Prescriptions   LIDOCAINE (XYLOCAINE) 5 % OINTMENT    Apply 1 application topically 3 (three) times daily as needed.   TRAMADOL (ULTRAM) 50 MG TABLET    Take 1 tablet (50 mg total) by mouth every 6 (six) hours as needed for severe pain.    Note:  This document was prepared using Dragon voice recognition software and may include unintentional dictation errors.  Nanda Quinton, MD, [facility]    Cathaleen Korol, Wonda [facility], MD 04/27/20 [phone]

## 2020-04-28 LAB — URINE CULTURE: Culture: NO GROWTH

## 2020-05-08 DIAGNOSIS — Z780 Asymptomatic menopausal state: Secondary | ICD-10-CM | POA: Diagnosis not present

## 2020-05-08 DIAGNOSIS — M8589 Other specified disorders of bone density and structure, multiple sites: Secondary | ICD-10-CM | POA: Diagnosis not present

## 2020-05-08 DIAGNOSIS — M8588 Other specified disorders of bone density and structure, other site: Secondary | ICD-10-CM | POA: Diagnosis not present

## 2020-06-06 DIAGNOSIS — L718 Other rosacea: Secondary | ICD-10-CM | POA: Diagnosis not present

## 2020-06-06 DIAGNOSIS — Z85828 Personal history of other malignant neoplasm of skin: Secondary | ICD-10-CM | POA: Diagnosis not present

## 2020-06-06 DIAGNOSIS — Z86008 Personal history of in-situ neoplasm of other site: Secondary | ICD-10-CM | POA: Diagnosis not present

## 2020-07-12 ENCOUNTER — Other Ambulatory Visit: Payer: Self-pay | Admitting: *Deleted

## 2020-07-12 MED ORDER — DICLOFENAC-MISOPROSTOL 75-0.2 MG PO TBEC: DELAYED_RELEASE_TABLET | ORAL | 3 refills | Status: DC

## 2020-08-08 ENCOUNTER — Ambulatory Visit (INDEPENDENT_AMBULATORY_CARE_PROVIDER_SITE_OTHER): Payer: Medicare PPO | Admitting: Family Medicine

## 2020-08-08 ENCOUNTER — Encounter: Payer: Self-pay | Admitting: Family Medicine

## 2020-08-08 ENCOUNTER — Other Ambulatory Visit: Payer: Self-pay

## 2020-08-08 DIAGNOSIS — Z0000 Encounter for general adult medical examination without abnormal findings: Secondary | ICD-10-CM

## 2020-08-08 MED ORDER — TRAMADOL HCL 50 MG PO TABS: ORAL_TABLET | ORAL | 5 refills | Status: DC

## 2020-08-08 NOTE — Progress Notes (Signed)
° ° °  CHIEF COMPLAINT / HPI: Checkup and follow-up on chronic medical problems.  She is doing well.  Brings me a copy of her most recent bone density test and wants to talk about that. 2.  Her eyedrops do not seem to be containing her symptoms of redness.  Wonders if there is anything different from what her ophthalmologist gave her. 3.  Wants me to look at a lesion on her skin of her right neck.   PERTINENT  PMH / PSH: I have reviewed the patients medications, allergies, past medical and surgical history, smoking status and updated in the EMR as appropriate.   OBJECTIVE:  BP 128/62    Pulse 91    Ht 5\' 2"  (1.575 m)    Wt 135 lb 12.8 oz (61.6 kg)    SpO2 98%    BMI 24.84 kg/m   SKIN: Small nodular area about 4 mm in diameter right neck.  No surrounding erythema.   Vital signs reviewed. GENERAL: Well-developed, well-nourished, no acute distress. CARDIOVASCULAR: Regular rate and rhythm no murmur gallop or rub LUNGS: Clear to auscultation bilaterally, no rales or wheeze. ABDOMEN: Soft positive bowel sounds NEURO: No gross focal neurological deficits. MSK: Movement of extremity x 4.   ASSESSMENT / PLAN: #1.  Skin lesion.  This might be a basal cell.  Given its location, I would prefer her to have her dermatologist look at it and she agrees to call and set up that appointment in the near future.  Well adult exam We discussed her bone density test.  Actually has been pretty stable.  Continue calcium and vitamin D and her exercise.  She is up-to-date on her health maintenance.  She has had her Covid vaccines.   Dorcas Mcmurray MD

## 2020-08-08 NOTE — Assessment & Plan Note (Signed)
We discussed her bone density test.  Actually has been pretty stable.  Continue calcium and vitamin D and her exercise.  She is up-to-date on her health maintenance.  She has had her Covid vaccines.

## 2020-08-08 NOTE — Patient Instructions (Signed)
Great to see you!   

## 2020-08-20 DIAGNOSIS — C4442 Squamous cell carcinoma of skin of scalp and neck: Secondary | ICD-10-CM | POA: Diagnosis not present

## 2020-10-12 ENCOUNTER — Other Ambulatory Visit: Payer: Self-pay

## 2020-10-12 MED ORDER — SIMVASTATIN 40 MG PO TABS: ORAL_TABLET | ORAL | 3 refills | Status: DC

## 2020-11-06 ENCOUNTER — Ambulatory Visit (INDEPENDENT_AMBULATORY_CARE_PROVIDER_SITE_OTHER): Payer: Medicare PPO | Admitting: Sports Medicine

## 2020-11-06 ENCOUNTER — Other Ambulatory Visit: Payer: Self-pay

## 2020-11-06 VITALS — BP 149/60 | Ht 62.0 in | Wt 135.0 lb

## 2020-11-06 DIAGNOSIS — M25562 Pain in left knee: Secondary | ICD-10-CM | POA: Diagnosis not present

## 2020-11-06 NOTE — Progress Notes (Signed)
   Subjective:    Patient ID: Margaret Barton, female    DOB: [birth year], 79 y.o.   MRN: [id number]  HPI chief complaint: Left knee pain  Margaret Barton comes in today having injured her left knee 2 days ago.  [place] when she twisted her left knee.  She had immediate pain and fell on the left knee.  Since then she has noticed pain deep within the knee especially with extension.  Knee will occasionally want to give way.  She is having trouble manipulating stairs.  She has a prescription for diclofenac and has been taking it once daily with some relief.  She has a known history of osteoarthritis in this knee and is planning on having a total knee arthroplasty in January with Dr. Noemi Chapel.  Interim medical history reviewed Medications reviewed Allergies reviewed    Review of Systems Above    Objective:   Physical Exam  Well-developed, well-nourished.  No acute distress  Left knee: There is about a 10 degree extension lag which is chronic.  Flexion is to about 110-120 degrees.  1+ effusion.  She is tender to palpation along both medial and lateral joint lines.  Knee is grossly stable ligamentous exam.  She ambulates with an antalgic gait.  Limited MSK ultrasound of the left knee was performed.  Patient has an obvious effusion in the suprapatellar pouch.  Medial compartment shows significant spurring but no obvious meniscal tear.      Assessment & Plan:   Left knee pain likely secondary to DJD  [facility] likely aggravated the known osteoarthritis in her left knee when she twisted it 2 days ago.  We are going to start with a body helix compression sleeve and she will increase her diclofenac to twice daily for the next 2 to 3 days (she is cautioned about GI upset and will take this with food).  I do not want her taking twice daily diclofenac for any longer than 3 days.  She has a walker at home to help assist with ambulation.  If symptoms do not improve  over the next 2 to 3 days, I would recommend follow-up with Dr. Noemi Chapel since definitive treatment is total knee arthroplasty.  Follow-up with me as needed.

## 2020-11-30 ENCOUNTER — Other Ambulatory Visit: Payer: Self-pay | Admitting: Family Medicine

## 2020-11-30 MED ORDER — CITALOPRAM HYDROBROMIDE 20 MG PO TABS: 20.0000 mg | ORAL_TABLET | Freq: Every day | ORAL | 3 refills | Status: DC

## 2020-11-30 NOTE — Progress Notes (Signed)
Phone discussion re her increased stress level and anxiety She will make appt for next week Has been on SSRI before and I will re-initiate that now.

## 2020-12-05 ENCOUNTER — Ambulatory Visit (INDEPENDENT_AMBULATORY_CARE_PROVIDER_SITE_OTHER): Payer: Medicare PPO | Admitting: Family Medicine

## 2020-12-05 ENCOUNTER — Encounter: Payer: Self-pay | Admitting: Family Medicine

## 2020-12-05 ENCOUNTER — Other Ambulatory Visit: Payer: Self-pay

## 2020-12-05 VITALS — BP 114/62 | HR 75 | Wt 133.0 lb

## 2020-12-05 DIAGNOSIS — Z658 Other specified problems related to psychosocial circumstances: Secondary | ICD-10-CM | POA: Insufficient documentation

## 2020-12-05 NOTE — Progress Notes (Signed)
    CHIEF COMPLAINT / HPI: #1. Follow-up recent stressors. She started the citalopram on Friday night and states she is already had some slight improvement. Having a lot of anxiety, and some heart racing. Feeling of impending doom. Difficulty sleeping. Difficulty relaxing. Denies suicidal or homicidal ideation.   PERTINENT  PMH / PSH: I have reviewed the patient's medications, allergies, past medical and surgical history, smoking status and updated in the EMR as appropriate.   OBJECTIVE:  BP 114/62   Pulse 75   Wt 133 lb (60.3 kg)   SpO2 97%   BMI 24.33 kg/m  PSYCH: AxOx4. Good eye contact.. No psychomotor retardation or agitation. Appropriate speech fluency and content. Asks and answers questions appropriately. Mood is congruent.   ASSESSMENT / PLAN:   Psychosocial stressors Long discussion. Absolutely think we need to continue with this medication. Discussed other options and lifestyle modifications etc. Total time spent 40 minutes. Follow-up by phone with new or worsening symptoms or other issues, otherwise I will see her in about a month.   Dorcas Mcmurray MD

## 2020-12-05 NOTE — Assessment & Plan Note (Signed)
Long discussion. Absolutely think we need to continue with this medication. Discussed other options and lifestyle modifications etc. Total time spent 40 minutes. Follow-up by phone with new or worsening symptoms or other issues, otherwise I will see her in about a month.

## 2020-12-26 DIAGNOSIS — Z859 Personal history of malignant neoplasm, unspecified: Secondary | ICD-10-CM | POA: Diagnosis not present

## 2020-12-26 DIAGNOSIS — L578 Other skin changes due to chronic exposure to nonionizing radiation: Secondary | ICD-10-CM | POA: Diagnosis not present

## 2020-12-26 DIAGNOSIS — L718 Other rosacea: Secondary | ICD-10-CM | POA: Diagnosis not present

## 2020-12-26 DIAGNOSIS — L570 Actinic keratosis: Secondary | ICD-10-CM | POA: Diagnosis not present

## 2021-01-30 DIAGNOSIS — M1712 Unilateral primary osteoarthritis, left knee: Secondary | ICD-10-CM | POA: Diagnosis not present

## 2021-02-18 DIAGNOSIS — Z01419 Encounter for gynecological examination (general) (routine) without abnormal findings: Secondary | ICD-10-CM | POA: Diagnosis not present

## 2021-02-18 DIAGNOSIS — Z90710 Acquired absence of both cervix and uterus: Secondary | ICD-10-CM | POA: Diagnosis not present

## 2021-03-12 DIAGNOSIS — H25813 Combined forms of age-related cataract, bilateral: Secondary | ICD-10-CM | POA: Diagnosis not present

## 2021-04-03 ENCOUNTER — Encounter: Payer: Self-pay | Admitting: Family Medicine

## 2021-04-03 ENCOUNTER — Ambulatory Visit (INDEPENDENT_AMBULATORY_CARE_PROVIDER_SITE_OTHER): Payer: Medicare PPO | Admitting: Family Medicine

## 2021-04-03 ENCOUNTER — Other Ambulatory Visit: Payer: Self-pay

## 2021-04-03 VITALS — BP 122/60 | HR 63 | Wt 136.2 lb

## 2021-04-03 DIAGNOSIS — M1712 Unilateral primary osteoarthritis, left knee: Secondary | ICD-10-CM | POA: Diagnosis not present

## 2021-04-03 DIAGNOSIS — D692 Other nonthrombocytopenic purpura: Secondary | ICD-10-CM | POA: Diagnosis not present

## 2021-04-03 DIAGNOSIS — R42 Dizziness and giddiness: Secondary | ICD-10-CM | POA: Diagnosis not present

## 2021-04-03 NOTE — Assessment & Plan Note (Addendum)
Was planning on getting TKR a few months ago but has put off till fall We discussed. If it gets too bad could consider CSI I think the lower leg is swelling when she wears the compression sleeve alone. I recommended paring it with compression knee high sock

## 2021-04-03 NOTE — Progress Notes (Signed)
    CHIEF COMPLAINT / HPI: 1. Concerned about bruising on her legs 2.trouble sleeping---related to stressors. Had decreased her dose of antidepressant to QOD. No particular reason, just trying to take less meds 3. Occasional light headedness. Not positional, it is random. Lasts seconds usually, no longer than a minute. No associated symptoms 4. Left knee pain and swelling. Her lower left leg is also somewhat swollen at times, wosrse when she wears the knee brace   PERTINENT  PMH / PSH: I have reviewed the patient's medications, allergies, past medical and surgical history, smoking status and updated in the EMR as appropriate.   OBJECTIVE:  BP 122/60   Pulse 63   Wt 136 lb 3.2 oz (61.8 kg)   SpO2 97%   BMI 24.91 kg/m  Vital signs reviewed. GENERAL: Well-developed, well-nourished, no acute distress. CARDIOVASCULAR: Regular rate and rhythm  LUNGS: no unusual work of breathing, rate normal NEURO: No gross focal neurological deficits. MSK: Movement of extremity x 4. Left knee mild effiusion. Lacks full extension by 10 degrees. Calf is soft without cord or deformity. Cal and lower leg are not swollen today SKIN: multiple areas of senile purpura onlower extremity--see photo under media    ASSESSMENT / PLAN:   Dizziness of unknown etiology Likely related to stressors with sleep disruption. Restart regular daily antidepressant dose. Labs F/u 4 weeks  Senile purpura ([place]) Lower extremity Explained likely etiology She has derm follolw up in June and will ask derm dr. Call if worse  Arthritis of knee, left Was planning on getting TKR a few months ago but has put off till fall We discussed. If it gets too bad could consider CSI I think the lower leg is swelling when she wears the compression sleeve alone. I recommended paring it with compression knee high sock   Dorcas Mcmurray MD

## 2021-04-03 NOTE — Patient Instructions (Signed)
Great to see you!   

## 2021-04-03 NOTE — Assessment & Plan Note (Signed)
Lower extremity Explained likely etiology She has derm follolw up in June and will ask derm dr. Call if worse

## 2021-04-03 NOTE — Assessment & Plan Note (Signed)
Likely related to stressors with sleep disruption. Restart regular daily antidepressant dose. Labs F/u 4 weeks

## 2021-04-04 LAB — CBC
Hematocrit: 41.7 % (ref 34.0–46.6)
Hemoglobin: 13.9 g/dL (ref 11.1–15.9)
MCH: 30.8 pg (ref 26.6–33.0)
MCHC: 33.3 g/dL (ref 31.5–35.7)
MCV: 93 fL (ref 79–97)
Platelets: 289 10*3/uL (ref 150–450)
RBC: 4.51 x10E6/uL (ref 3.77–5.28)
RDW: 12.7 % (ref 11.7–15.4)
WBC: 7 10*3/uL (ref 3.4–10.8)

## 2021-04-04 LAB — COMPREHENSIVE METABOLIC PANEL
ALT: 24 IU/L (ref 0–32)
AST: 28 IU/L (ref 0–40)
Albumin/Globulin Ratio: 1.8 (ref 1.2–2.2)
Albumin: 4.5 g/dL (ref 3.7–4.7)
Alkaline Phosphatase: 84 IU/L (ref 44–121)
BUN/Creatinine Ratio: 21 (ref 12–28)
BUN: 13 mg/dL (ref 8–27)
Bilirubin Total: 0.4 mg/dL (ref 0.0–1.2)
CO2: 19 mmol/L — ABNORMAL LOW (ref 20–29)
Calcium: 9.7 mg/dL (ref 8.7–10.3)
Chloride: 100 mmol/L (ref 96–106)
Creatinine, Ser: 0.61 mg/dL (ref 0.57–1.00)
Globulin, Total: 2.5 g/dL (ref 1.5–4.5)
Glucose: 97 mg/dL (ref 65–99)
Potassium: 4.3 mmol/L (ref 3.5–5.2)
Sodium: 139 mmol/L (ref 134–144)
Total Protein: 7 g/dL (ref 6.0–8.5)
eGFR: 92 mL/min/{1.73_m2} (ref >59)

## 2021-04-04 LAB — TSH: TSH: 3.42 u[IU]/mL (ref 0.450–4.500)

## 2021-04-08 ENCOUNTER — Encounter: Payer: Self-pay | Admitting: Family Medicine

## 2021-05-08 ENCOUNTER — Ambulatory Visit: Payer: Medicare PPO | Admitting: Family Medicine

## 2021-05-21 ENCOUNTER — Other Ambulatory Visit: Payer: Self-pay

## 2021-05-21 ENCOUNTER — Ambulatory Visit (INDEPENDENT_AMBULATORY_CARE_PROVIDER_SITE_OTHER): Payer: Medicare PPO | Admitting: Sports Medicine

## 2021-05-21 ENCOUNTER — Ambulatory Visit: Payer: Self-pay

## 2021-05-21 VITALS — BP 152/86 | Ht 62.0 in | Wt 133.0 lb

## 2021-05-21 DIAGNOSIS — M25552 Pain in left hip: Secondary | ICD-10-CM

## 2021-05-21 DIAGNOSIS — M1712 Unilateral primary osteoarthritis, left knee: Secondary | ICD-10-CM

## 2021-05-21 DIAGNOSIS — S76019A Strain of muscle, fascia and tendon of unspecified hip, initial encounter: Secondary | ICD-10-CM | POA: Insufficient documentation

## 2021-05-21 MED ORDER — METHYLPREDNISOLONE ACETATE 40 MG/ML IJ SUSP
40.0000 mg | Freq: Once | INTRAMUSCULAR | Status: AC
  Administered 2021-05-21: 40 mg via INTRA_ARTICULAR

## 2021-05-21 NOTE — Progress Notes (Signed)
Left lateral hip pain  Became severe this week Unable to walk on beach Radiates down to lateral knee This side has arthritic knee and walking with a limp She wonders if the knee problem has triggered more pain at the hip She feels like her legs are now a different limp  Pain is severe if she lies on that side She points to the local area over her lateral and posterior buttocks that is painful  Review of systems Some chronic low back pain but no recent flares Pain radiates down the lateral side of the left thigh but only to her knee No true sciatic symptoms  Physical exam Pleasant older female in no acute distress BP (!) 152/86   Ht 5\' 2"  (1.575 m)   Wt 133 lb (60.3 kg)   BMI 24.33 kg/m  BP (!) 152/86   Ht 5\' 2"  (1.575 m)   Wt 133 lb (60.3 kg)   BMI 24.33 kg/m  No flowsheet data found.  Left and right hip show good range of motion Left Hip She has palpable tenderness over the gluteus medius just above the greater trochanter She also has tenderness at the piriformis insertion Significant arthritis change in the left knee This causes the leg length to be 2 cm shorter on the left On walking gait she rotates her pelvis and has a slight Trendelenburg with antalgia  Strength testing is pretty well-preserved  Ultrasound of left hip There is significant hypoechoic change in the gluteus medius at the point of maximal tenderness The piriformis also shows significant hypoechoic change moving toward its attachment to the greater trochanter There is no bursal swelling at the trochanter Tendons at the iliac crest and at the greater trochanter of the gluteus medius look intact  Impression gluteus medius syndrome  Ultrasound and interpretation by Wolfgang Phoenix. Xaiden Fleig, MD  Procedure:  Injection of Left gluteus medius Consent obtained and verified. Time-out conducted. Noted no overlying erythema, induration, or other signs of local infection. Skin prepped in a sterile fashion. Topical  analgesic spray: Ethyl chloride. Completed without difficulty.  The area of hypoechoic change was ideintified and marked with [place].  I used a 25 G 1.5 inch needle to inject at a depth of 3 to 4 cms in the area of swelling Meds: 1 cc solumedrol 40 + 4 ccs of lidocaine 1% Advised to call if fevers/chills, erythema, induration, drainage, or persistent bleeding.

## 2021-05-21 NOTE — Assessment & Plan Note (Signed)
This is advanced She has a flexion contracture on standing This results in a 2 cm shorter left leg in a standing position  A lift was added to her shoe and her walking gait improved with that

## 2021-05-21 NOTE — Assessment & Plan Note (Signed)
CSI today Rest x 4 days Icing Correct leg length and hopefully this will reduce strain on hip  Easy motion exercises in the swimming pool daily

## 2021-05-27 DIAGNOSIS — Z23 Encounter for immunization: Secondary | ICD-10-CM | POA: Diagnosis not present

## 2021-05-28 ENCOUNTER — Other Ambulatory Visit: Payer: Self-pay | Admitting: Sports Medicine

## 2021-05-28 MED ORDER — CYCLOBENZAPRINE HCL 10 MG PO TABS: 10.0000 mg | ORAL_TABLET | Freq: Every evening | ORAL | 0 refills | Status: DC | PRN

## 2021-05-28 NOTE — Progress Notes (Signed)
ADDENDUM  Only partial relief from CSI  I reordered flexeril today which she has used in the past.

## 2021-07-03 DIAGNOSIS — L820 Inflamed seborrheic keratosis: Secondary | ICD-10-CM | POA: Diagnosis not present

## 2021-07-03 DIAGNOSIS — I878 Other specified disorders of veins: Secondary | ICD-10-CM | POA: Diagnosis not present

## 2021-07-03 DIAGNOSIS — Z859 Personal history of malignant neoplasm, unspecified: Secondary | ICD-10-CM | POA: Diagnosis not present

## 2021-07-07 ENCOUNTER — Other Ambulatory Visit: Payer: Self-pay | Admitting: Family Medicine

## 2021-08-06 DIAGNOSIS — M1712 Unilateral primary osteoarthritis, left knee: Secondary | ICD-10-CM | POA: Diagnosis not present

## 2021-08-09 ENCOUNTER — Other Ambulatory Visit: Payer: Self-pay | Admitting: Family Medicine

## 2021-08-14 ENCOUNTER — Encounter: Payer: Self-pay | Admitting: Family Medicine

## 2021-08-14 ENCOUNTER — Ambulatory Visit (INDEPENDENT_AMBULATORY_CARE_PROVIDER_SITE_OTHER): Payer: Medicare PPO | Admitting: Family Medicine

## 2021-08-14 ENCOUNTER — Other Ambulatory Visit: Payer: Self-pay

## 2021-08-14 VITALS — BP 126/57 | HR 54 | Ht 62.0 in | Wt 133.0 lb

## 2021-08-14 DIAGNOSIS — Z79899 Other long term (current) drug therapy: Secondary | ICD-10-CM

## 2021-08-14 DIAGNOSIS — Z658 Other specified problems related to psychosocial circumstances: Secondary | ICD-10-CM | POA: Diagnosis not present

## 2021-08-14 DIAGNOSIS — Z0000 Encounter for general adult medical examination without abnormal findings: Secondary | ICD-10-CM

## 2021-08-14 DIAGNOSIS — E785 Hyperlipidemia, unspecified: Secondary | ICD-10-CM | POA: Diagnosis not present

## 2021-08-14 MED ORDER — CITALOPRAM HYDROBROMIDE 20 MG PO TABS: 20.0000 mg | ORAL_TABLET | Freq: Every day | ORAL | 3 refills | Status: DC

## 2021-08-14 MED ORDER — MONTELUKAST SODIUM 10 MG PO TABS: 10.0000 mg | ORAL_TABLET | Freq: Every day | ORAL | 3 refills | Status: DC

## 2021-08-14 MED ORDER — ESTRADIOL 0.1 MG/24HR TD PTTW: 1.0000 | MEDICATED_PATCH | TRANSDERMAL | 3 refills | Status: DC

## 2021-08-14 MED ORDER — TRAMADOL HCL 50 MG PO TABS: ORAL_TABLET | ORAL | 5 refills | Status: DC

## 2021-08-14 NOTE — Assessment & Plan Note (Signed)
Much improved. Continue citalopram.  

## 2021-08-14 NOTE — Patient Instructions (Signed)
Five days before surgery, stop diclofenac/misoprostyl and simvastatin.   Do not take the estradiol patch at all that week. You can restart everything back after the surgery.   I will fax your labs and send you a copy.

## 2021-08-14 NOTE — Assessment & Plan Note (Addendum)
We will just have her stop her simvastatin for the 5 days prior to surgery.  Anesthesia had given her a blanket statement to stop all of her medicines but I think we will just stop this 1, her diclofenac/misoprostol and her estradiol.  I think she can continue on the others except potentially tramadol to be discontinued on the day of surgery.  She is in agreement with this.  She can restart postsurgery.

## 2021-08-14 NOTE — Progress Notes (Signed)
    CHIEF COMPLAINT / HPI:   Follow-up on left knee arthritis.  Plans to have total knee replacement in the next few weeks.  Needs some lab work drawn for that.  Also needs to discuss medications that she is on and which have though she should stop or hold while she is preparing for and recovering from surgery.  Has a spot that her dermatologist froze off but looks like it needs another freeze.  Her dermatologist is a little hard to get in with so she asks if I can do that today.  Otherwise doing well.  PERTINENT  PMH / PSH: I have reviewed the patient's medications, allergies, past medical and surgical history, smoking status and updated in the EMR as appropriate.   OBJECTIVE:  BP (!) 126/57   Pulse (!) 54   Ht '5\' 2"'$  (1.575 m)   Wt 133 lb (60.3 kg)   SpO2 99%   BMI 24.33 kg/m  Vital signs reviewed. GENERAL: Well-developed, well-nourished, no acute distress. CARDIOVASCULAR: Regular rate and rhythm no murmur gallop or rub LUNGS: Normal work of breathing NEURO: No gross focal neurological deficits. MSK: Movement of extremity x 4.  Left knee external changes of osteoarthritis in medial and lateral joint line tenderness   ASSESSMENT / PLAN: Well adult checkup.  I refers to small seborrheic keratosis on her belly.  We will get lab work for her upcoming total knee replacement.  Continue other medications unchanged except for the week of surgery.  Follow-up as needed.  Health maintenance up-to-date.  Hyperlipidemia We will just have her stop her simvastatin for the 5 days prior to surgery.  Anesthesia had given her a blanket statement to stop all of her medicines but I think we will just stop this 1, her diclofenac/misoprostol and her estradiol.  I think she can continue on the others except potentially tramadol to be discontinued on the day of surgery.  She is in agreement with this.  She can restart postsurgery.  Psychosocial stressors Much improved.  Continue citalopram.   Dorcas Mcmurray  MD

## 2021-08-15 LAB — COMPREHENSIVE METABOLIC PANEL WITH GFR
ALT: 21 IU/L (ref 0–32)
AST: 24 IU/L (ref 0–40)
Albumin/Globulin Ratio: 2.3 — ABNORMAL HIGH (ref 1.2–2.2)
Albumin: 4.3 g/dL (ref 3.7–4.7)
Alkaline Phosphatase: 68 IU/L (ref 44–121)
BUN/Creatinine Ratio: 13 (ref 12–28)
BUN: 10 mg/dL (ref 8–27)
Bilirubin Total: 0.4 mg/dL (ref 0.0–1.2)
CO2: 23 mmol/L (ref 20–29)
Calcium: 9.3 mg/dL (ref 8.7–10.3)
Chloride: 102 mmol/L (ref 96–106)
Creatinine, Ser: 0.76 mg/dL (ref 0.57–1.00)
Globulin, Total: 1.9 g/dL (ref 1.5–4.5)
Glucose: 81 mg/dL (ref 65–99)
Potassium: 4.2 mmol/L (ref 3.5–5.2)
Sodium: 140 mmol/L (ref 134–144)
Total Protein: 6.2 g/dL (ref 6.0–8.5)
eGFR: 81 mL/min/1.73

## 2021-08-15 LAB — CBC
Hematocrit: 40.9 % (ref 34.0–46.6)
Hemoglobin: 13.7 g/dL (ref 11.1–15.9)
MCH: 30.8 pg (ref 26.6–33.0)
MCHC: 33.5 g/dL (ref 31.5–35.7)
MCV: 92 fL (ref 79–97)
Platelets: 259 10*3/uL (ref 150–450)
RBC: 4.45 x10E6/uL (ref 3.77–5.28)
RDW: 12.4 % (ref 11.7–15.4)
WBC: 6.2 10*3/uL (ref 3.4–10.8)

## 2021-08-15 LAB — PROTIME-INR
INR: 0.9 (ref 0.9–1.2)
Prothrombin Time: 9.9 s (ref 9.1–12.0)

## 2021-08-20 DIAGNOSIS — R791 Abnormal coagulation profile: Secondary | ICD-10-CM | POA: Diagnosis not present

## 2021-08-20 DIAGNOSIS — M1712 Unilateral primary osteoarthritis, left knee: Secondary | ICD-10-CM | POA: Diagnosis not present

## 2021-08-20 DIAGNOSIS — M25562 Pain in left knee: Secondary | ICD-10-CM | POA: Diagnosis not present

## 2021-08-20 DIAGNOSIS — Z01812 Encounter for preprocedural laboratory examination: Secondary | ICD-10-CM | POA: Diagnosis not present

## 2021-08-21 ENCOUNTER — Encounter: Payer: Self-pay | Admitting: Family Medicine

## 2021-09-04 DIAGNOSIS — G8918 Other acute postprocedural pain: Secondary | ICD-10-CM | POA: Diagnosis not present

## 2021-09-04 DIAGNOSIS — M1712 Unilateral primary osteoarthritis, left knee: Secondary | ICD-10-CM | POA: Diagnosis not present

## 2021-09-06 DIAGNOSIS — M1712 Unilateral primary osteoarthritis, left knee: Secondary | ICD-10-CM | POA: Diagnosis not present

## 2021-09-06 DIAGNOSIS — Z96652 Presence of left artificial knee joint: Secondary | ICD-10-CM | POA: Diagnosis not present

## 2021-09-11 DIAGNOSIS — M25562 Pain in left knee: Secondary | ICD-10-CM | POA: Diagnosis not present

## 2021-09-11 DIAGNOSIS — Z96652 Presence of left artificial knee joint: Secondary | ICD-10-CM | POA: Diagnosis not present

## 2021-09-12 DIAGNOSIS — M1712 Unilateral primary osteoarthritis, left knee: Secondary | ICD-10-CM | POA: Diagnosis not present

## 2021-09-13 DIAGNOSIS — M25562 Pain in left knee: Secondary | ICD-10-CM | POA: Diagnosis not present

## 2021-09-13 DIAGNOSIS — Z96652 Presence of left artificial knee joint: Secondary | ICD-10-CM | POA: Diagnosis not present

## 2021-09-16 DIAGNOSIS — Z96652 Presence of left artificial knee joint: Secondary | ICD-10-CM | POA: Diagnosis not present

## 2021-09-16 DIAGNOSIS — M25562 Pain in left knee: Secondary | ICD-10-CM | POA: Diagnosis not present

## 2021-09-18 DIAGNOSIS — Z96652 Presence of left artificial knee joint: Secondary | ICD-10-CM | POA: Diagnosis not present

## 2021-09-18 DIAGNOSIS — M25562 Pain in left knee: Secondary | ICD-10-CM | POA: Diagnosis not present

## 2021-09-20 DIAGNOSIS — M1712 Unilateral primary osteoarthritis, left knee: Secondary | ICD-10-CM | POA: Diagnosis not present

## 2021-09-23 DIAGNOSIS — M25562 Pain in left knee: Secondary | ICD-10-CM | POA: Diagnosis not present

## 2021-09-25 DIAGNOSIS — M25562 Pain in left knee: Secondary | ICD-10-CM | POA: Diagnosis not present

## 2021-09-27 DIAGNOSIS — M25562 Pain in left knee: Secondary | ICD-10-CM | POA: Diagnosis not present

## 2021-09-30 DIAGNOSIS — M25562 Pain in left knee: Secondary | ICD-10-CM | POA: Diagnosis not present

## 2021-10-02 DIAGNOSIS — M25562 Pain in left knee: Secondary | ICD-10-CM | POA: Diagnosis not present

## 2021-10-04 DIAGNOSIS — M25562 Pain in left knee: Secondary | ICD-10-CM | POA: Diagnosis not present

## 2021-10-08 ENCOUNTER — Other Ambulatory Visit: Payer: Self-pay | Admitting: *Deleted

## 2021-10-10 MED ORDER — SIMVASTATIN 40 MG PO TABS: ORAL_TABLET | ORAL | 3 refills | Status: DC

## 2021-10-22 DIAGNOSIS — M25562 Pain in left knee: Secondary | ICD-10-CM | POA: Diagnosis not present

## 2021-11-05 DIAGNOSIS — M25562 Pain in left knee: Secondary | ICD-10-CM | POA: Diagnosis not present

## 2022-01-01 DIAGNOSIS — D0472 Carcinoma in situ of skin of left lower limb, including hip: Secondary | ICD-10-CM | POA: Diagnosis not present

## 2022-01-01 DIAGNOSIS — L570 Actinic keratosis: Secondary | ICD-10-CM | POA: Diagnosis not present

## 2022-01-01 DIAGNOSIS — L821 Other seborrheic keratosis: Secondary | ICD-10-CM | POA: Diagnosis not present

## 2022-01-01 DIAGNOSIS — Z859 Personal history of malignant neoplasm, unspecified: Secondary | ICD-10-CM | POA: Diagnosis not present

## 2022-01-01 DIAGNOSIS — D485 Neoplasm of uncertain behavior of skin: Secondary | ICD-10-CM | POA: Diagnosis not present

## 2022-01-22 DIAGNOSIS — L570 Actinic keratosis: Secondary | ICD-10-CM | POA: Diagnosis not present

## 2022-01-22 DIAGNOSIS — D0472 Carcinoma in situ of skin of left lower limb, including hip: Secondary | ICD-10-CM | POA: Diagnosis not present

## 2022-02-21 ENCOUNTER — Other Ambulatory Visit: Payer: Self-pay | Admitting: *Deleted

## 2022-02-21 MED ORDER — CITALOPRAM HYDROBROMIDE 20 MG PO TABS: 20.0000 mg | ORAL_TABLET | Freq: Every day | ORAL | 3 refills | Status: DC

## 2022-03-18 DIAGNOSIS — H25813 Combined forms of age-related cataract, bilateral: Secondary | ICD-10-CM | POA: Diagnosis not present

## 2022-03-24 IMAGING — CT CT RENAL STONE PROTOCOL
2 of 4 series · 16 of 46 positions shown, 18 images · non-contrast
Comparison: None.

CLINICAL DATA: Left flank pain for 1 day.

EXAM:
CT ABDOMEN AND PELVIS WITHOUT CONTRAST
TECHNIQUE: Multidetector CT imaging of the abdomen and pelvis was performed
following the standard protocol without IV contrast.

[Series 2: axial st · axial · 0.87mm/px · z∈[-365,+30]mm · 13 of 87 slices shown, 15 images]
[im 4/87  soft-tissue]
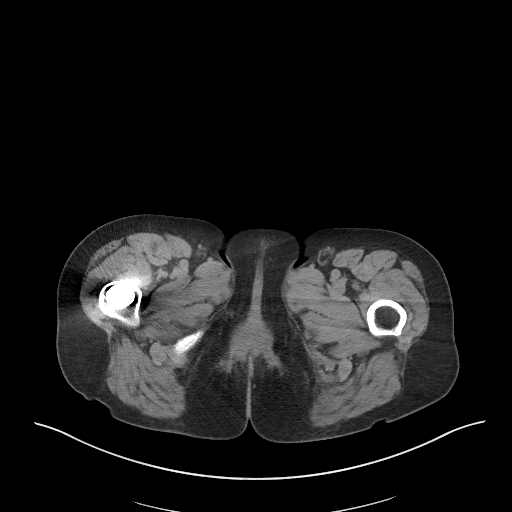
[im 4/87  bone]
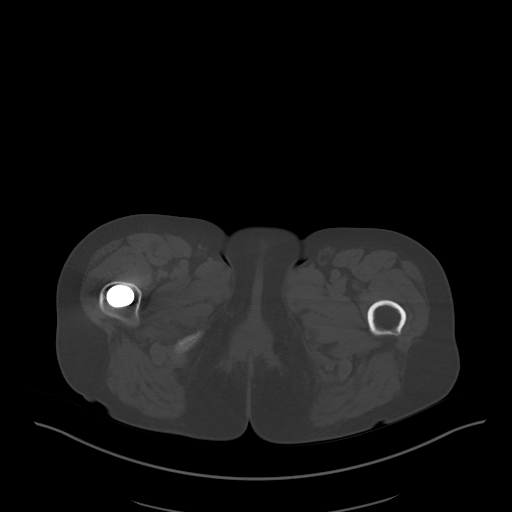
[im 11/87  soft-tissue]
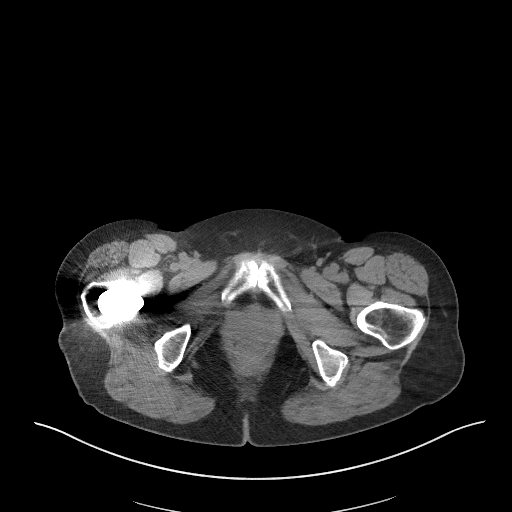
[im 18/87  soft-tissue]
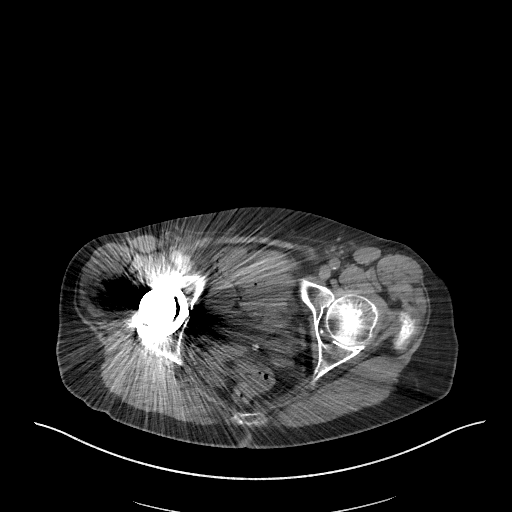
[im 25/87  soft-tissue]
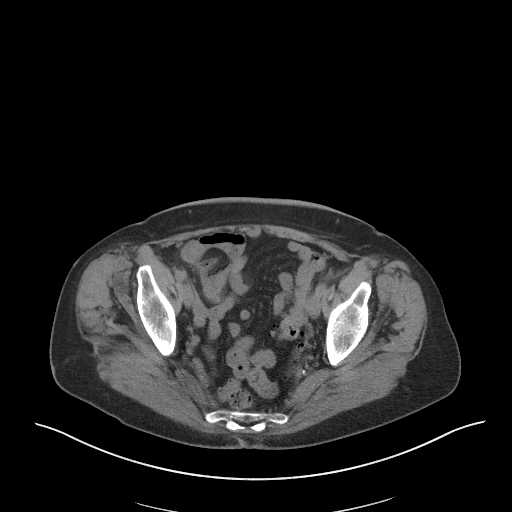
[im 31/87  soft-tissue]
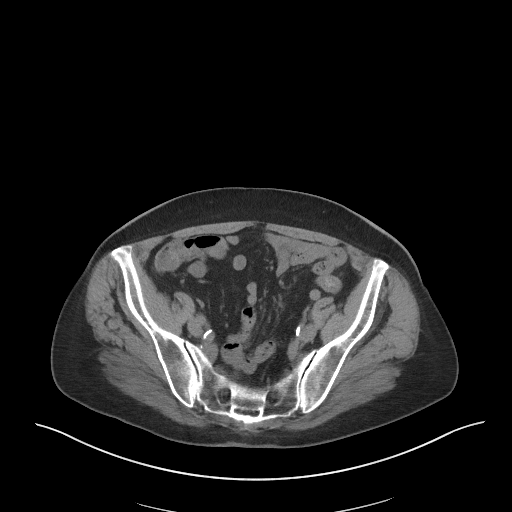
[im 38/87  soft-tissue]
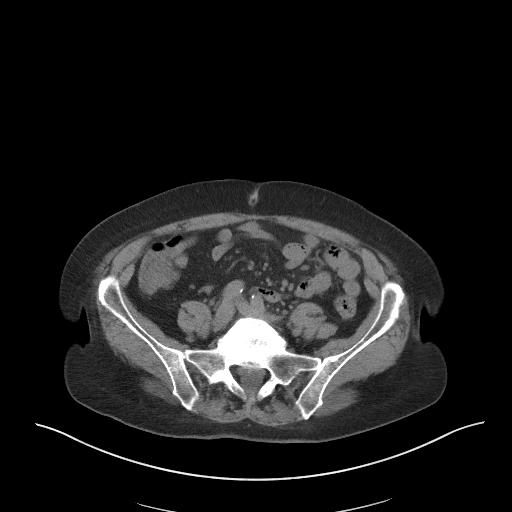
[im 45/87  soft-tissue]
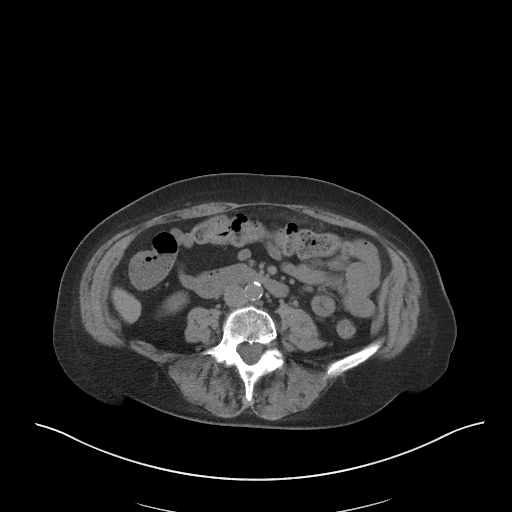
[im 49/87  soft-tissue]
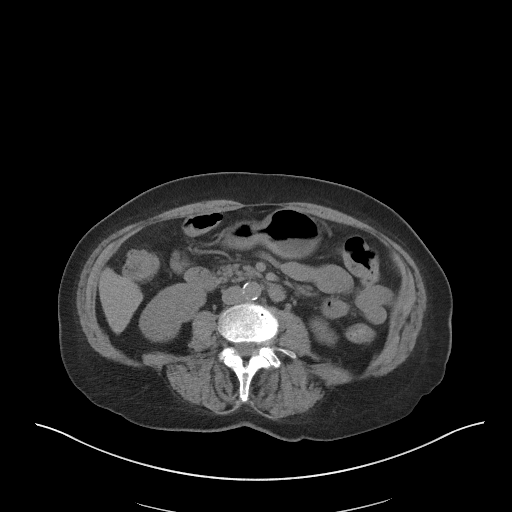
[im 56/87  soft-tissue]
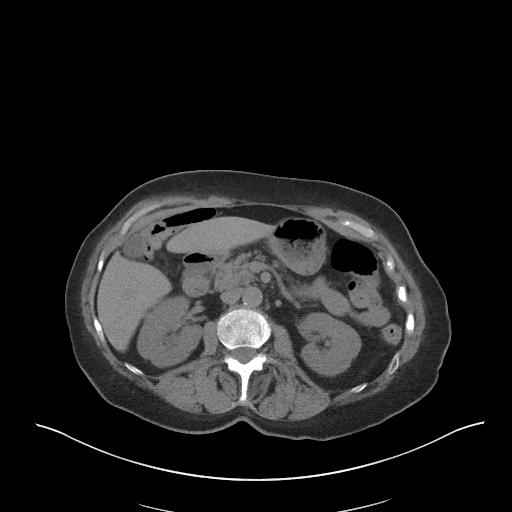
[im 56/87  bone]
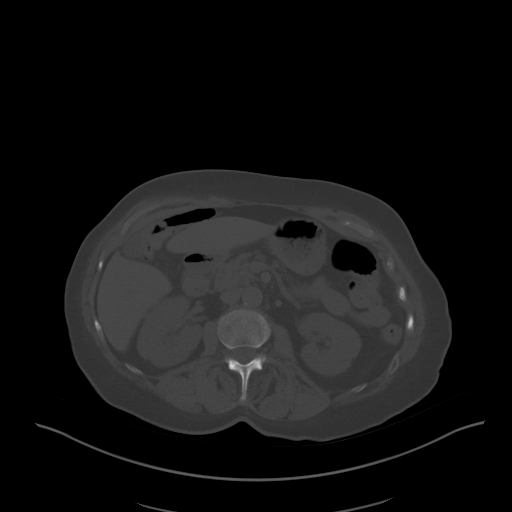
[im 62/87  soft-tissue]
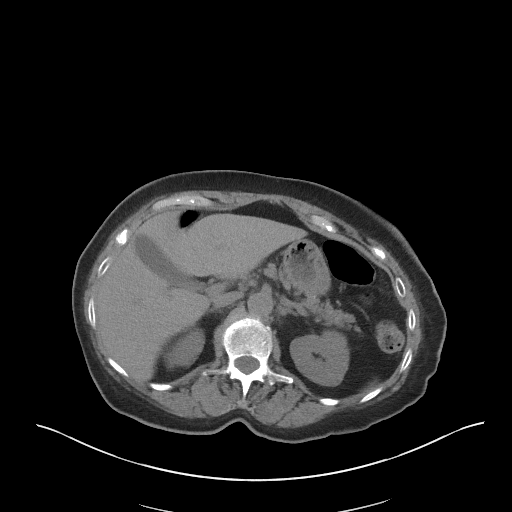
[im 69/87  soft-tissue]
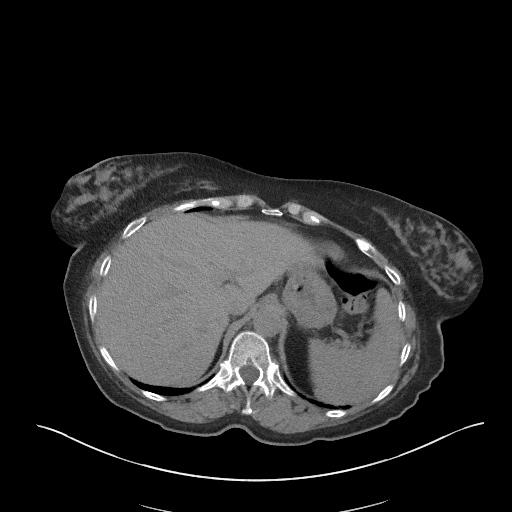
[im 76/87  soft-tissue]
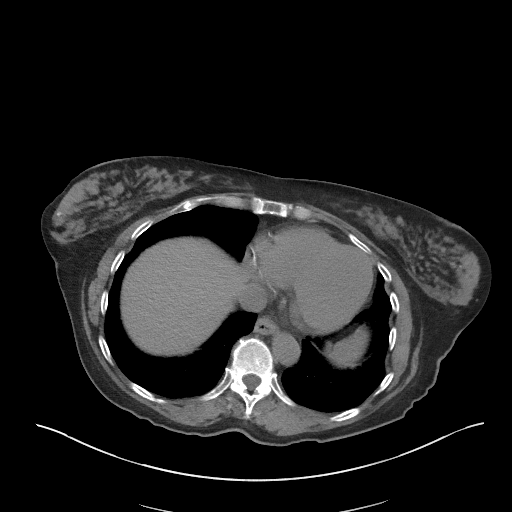
[im 83/87  soft-tissue]
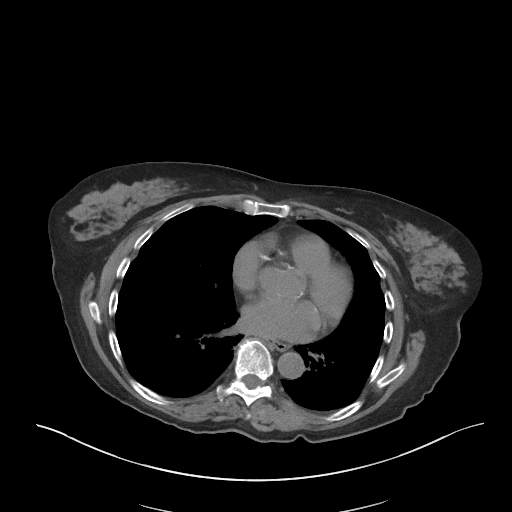

[Series 5: coronal st · coronal · 0.79mm/px · 3 of 85 slices shown]
[im 29/85  soft-tissue]
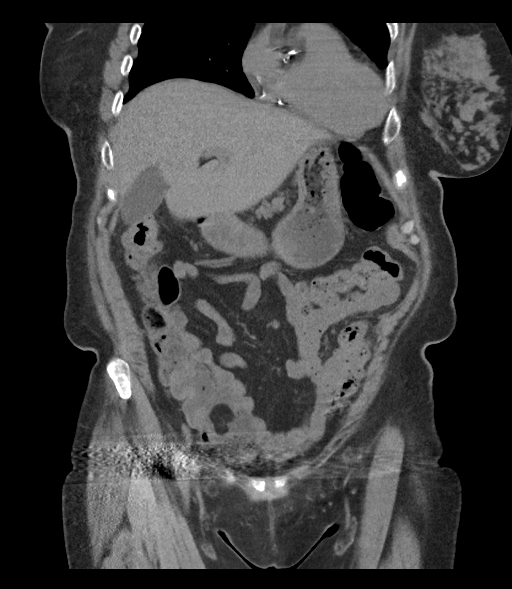
[im 38/85  soft-tissue]
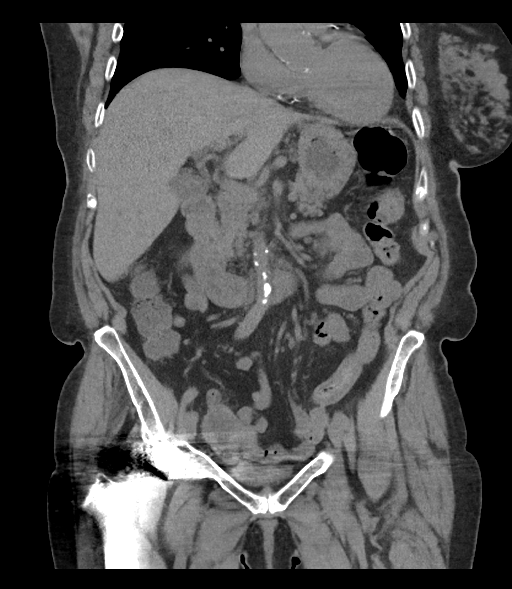
[im 47/85  soft-tissue]
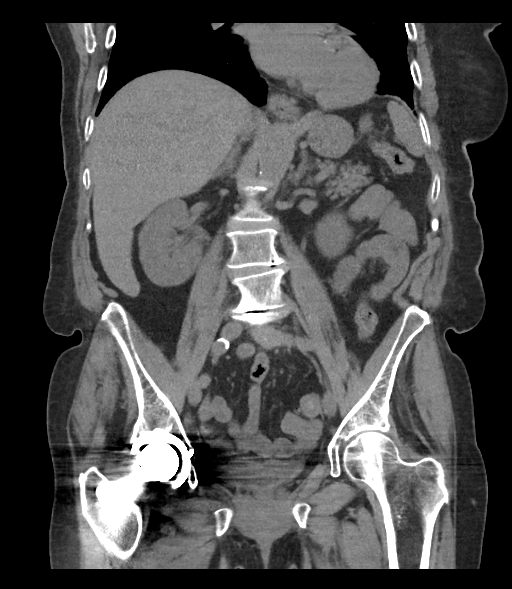

[16 of 46 positions shown; findings below may reference images not displayed]

FINDINGS: Lower chest: Lung bases clear. Heart size normal. Calcific coronary
atherosclerosis noted.

Hepatobiliary: No focal liver abnormality is seen. No gallstones,
gallbladder wall thickening, or biliary dilatation.

Pancreas: Unremarkable. No pancreatic ductal dilatation or
surrounding inflammatory changes.

Spleen: Normal in size without focal abnormality.

Adrenals/Urinary Tract: The adrenal glands appear normal. There are
no renal or ureteral stones. No hydronephrosis on the right or left.
1.2 cm hyperattenuating focus in the lower pole of the left kidney
is likely a complex cyst. Urinary bladder appears normal.

Stomach/Bowel: Stomach is within normal limits. Appendix appears
normal. Scratch the appendix is not visualized but no evidence of
appendicitis is seen. No evidence of bowel wall thickening,
distention, or inflammatory changes.

Vascular/Lymphatic: Aortic atherosclerosis. No enlarged abdominal or
pelvic lymph nodes.

Reproductive: Status post hysterectomy. No adnexal masses.

Other: Very small fat containing umbilical hernia noted.

Musculoskeletal: The patient has a unilateral pars interarticularis
defect on the left at L5 and advanced facet arthropathy on the
right. There is approximately 1 cm anterolisthesis L5 on S1.
Degenerative disc disease L3-4, L4-5 and L5-S1 is seen. Status post
right hip replacement.
IMPRESSION: Negative for urinary tract stone. No acute abnormality or finding to
explain the patient's symptoms.

Aortic Atherosclerosis ([id number]). Calcific coronary artery
disease also noted.

Lower lumbar spondylosis as described.

## 2022-04-04 ENCOUNTER — Ambulatory Visit (INDEPENDENT_AMBULATORY_CARE_PROVIDER_SITE_OTHER): Payer: Medicare PPO | Admitting: Family Medicine

## 2022-04-04 VITALS — BP 144/61 | Ht 61.0 in | Wt 130.0 lb

## 2022-04-04 DIAGNOSIS — M47812 Spondylosis without myelopathy or radiculopathy, cervical region: Secondary | ICD-10-CM

## 2022-04-07 ENCOUNTER — Encounter: Payer: Self-pay | Admitting: Family Medicine

## 2022-04-07 DIAGNOSIS — M47812 Spondylosis without myelopathy or radiculopathy, cervical region: Secondary | ICD-10-CM | POA: Insufficient documentation

## 2022-04-07 NOTE — Addendum Note (Signed)
Addended byDorcas Mcmurray L on: 04/07/2022 10:29 AM ? ? Modules accepted: Level of Service ? ?

## 2022-04-07 NOTE — Progress Notes (Signed)
?  Margaret Barton - 81 y.o. female MRN [id number]  Date of birth: 10-26-1941 ? ? ? ?SUBJECTIVE:    ?  ?Chief Complaint:/ HPI:  ? Pain in several areas of scalp. Intermittent. Gets better if she puts pressure on area. Occurring increasingly over many weeks. Worried she has something bad like brain tumor. No visual changes. No focal deficits during any episode.  Does have increasing neck stiffness and having some difficulty looking over her shoulder during driving. Had to get less robust pillow. ? ? ? ?OBJECTIVE: BP (!) 144/61   Ht '5\' 1"'$  (1.549 m)   Wt 130 lb (59 kg)   BMI 24.56 kg/m?   ?Physical Exam:  Vital signs are reviewed. ?GEN WD WN NAD ?NECK very limuited lateral rotation, about 10-15 degrees each side. Limited flexion and extension (30 and 20 approximately respectively. Negative spurlings. ?Areas of scalp where she indicates pain episodes : right greater and lesser occipital nerve territory ? ?ASSESSMENT & PLAN: ? ?See problem based charting & AVS for pt instructions. ?Neck arthritis ?Greater and lesser occipital nerualgia secondary to cervical spine DJD. Very limited ROM of head. Will try 100 mg gabapentin and night and she will let me know how it is worki9ng ion 2-3 weeks. Reassured this is not brain tumor. ? ?

## 2022-04-07 NOTE — Assessment & Plan Note (Signed)
Greater and lesser occipital nerualgia secondary to cervical spine DJD. Very limited ROM of head. Will try 100 mg gabapentin and night and she will let me know how it is worki9ng ion 2-3 weeks. Reassured this is not brain tumor. ?

## 2022-05-27 ENCOUNTER — Encounter: Payer: Self-pay | Admitting: *Deleted

## 2022-07-16 DIAGNOSIS — D485 Neoplasm of uncertain behavior of skin: Secondary | ICD-10-CM | POA: Diagnosis not present

## 2022-07-16 DIAGNOSIS — Z86008 Personal history of in-situ neoplasm of other site: Secondary | ICD-10-CM | POA: Diagnosis not present

## 2022-07-16 DIAGNOSIS — Z859 Personal history of malignant neoplasm, unspecified: Secondary | ICD-10-CM | POA: Diagnosis not present

## 2022-07-16 DIAGNOSIS — D04112 Carcinoma in situ of skin of right lower eyelid, including canthus: Secondary | ICD-10-CM | POA: Diagnosis not present

## 2022-07-16 DIAGNOSIS — Z129 Encounter for screening for malignant neoplasm, site unspecified: Secondary | ICD-10-CM | POA: Diagnosis not present

## 2022-07-16 DIAGNOSIS — L821 Other seborrheic keratosis: Secondary | ICD-10-CM | POA: Diagnosis not present

## 2022-07-16 DIAGNOSIS — B351 Tinea unguium: Secondary | ICD-10-CM | POA: Diagnosis not present

## 2022-07-25 ENCOUNTER — Other Ambulatory Visit: Payer: Self-pay | Admitting: *Deleted

## 2022-07-25 NOTE — Patient Outreach (Signed)
  Care Coordination   07/25/2022 Name: Margaret Barton MRN: [id number] DOB: 1941-06-03   Care Coordination Outreach Attempts:  An unsuccessful telephone outreach was attempted today to offer the patient information about available care coordination services as a benefit of their health plan.   Follow Up Plan:  Additional outreach attempts will be made to offer the patient care coordination information and services.   Encounter Outcome:  No Answer  Care Coordination Interventions Activated:  No   Care Coordination Interventions:  No, not indicated    Emelia Loron RN, BSN [place] [phone] [email or web address]

## 2022-08-20 ENCOUNTER — Ambulatory Visit (INDEPENDENT_AMBULATORY_CARE_PROVIDER_SITE_OTHER): Payer: Medicare PPO | Admitting: Family Medicine

## 2022-08-20 ENCOUNTER — Encounter: Payer: Self-pay | Admitting: Family Medicine

## 2022-08-20 VITALS — BP 133/55 | HR 56 | Ht 61.0 in | Wt 130.6 lb

## 2022-08-20 DIAGNOSIS — Z01419 Encounter for gynecological examination (general) (routine) without abnormal findings: Secondary | ICD-10-CM | POA: Diagnosis not present

## 2022-08-20 DIAGNOSIS — E785 Hyperlipidemia, unspecified: Secondary | ICD-10-CM | POA: Diagnosis not present

## 2022-08-20 MED ORDER — GABAPENTIN 300 MG PO CAPS: ORAL_CAPSULE | ORAL | 3 refills | Status: DC

## 2022-08-20 MED ORDER — DICLOFENAC-MISOPROSTOL 75-0.2 MG PO TBEC: 1.0000 | DELAYED_RELEASE_TABLET | Freq: Two times a day (BID) | ORAL | 3 refills | Status: DC | PRN

## 2022-08-20 NOTE — Patient Instructions (Signed)
Taper up on t he gabapentin starting at 2 per night and increasing to 3 at night in one week. Then stay at the 3 at night dose and if that not working, let me know.

## 2022-08-21 ENCOUNTER — Encounter: Payer: Self-pay | Admitting: Family Medicine

## 2022-08-21 LAB — COMPREHENSIVE METABOLIC PANEL
ALT: 20 IU/L (ref 0–32)
AST: 20 IU/L (ref 0–40)
Albumin/Globulin Ratio: 2.1 (ref 1.2–2.2)
Albumin: 4.5 g/dL (ref 3.8–4.8)
Alkaline Phosphatase: 69 IU/L (ref 44–121)
BUN/Creatinine Ratio: 19 (ref 12–28)
BUN: 12 mg/dL (ref 8–27)
Bilirubin Total: 0.4 mg/dL (ref 0.0–1.2)
CO2: 22 mmol/L (ref 20–29)
Calcium: 9.4 mg/dL (ref 8.7–10.3)
Chloride: 103 mmol/L (ref 96–106)
Creatinine, Ser: 0.64 mg/dL (ref 0.57–1.00)
Globulin, Total: 2.1 g/dL (ref 1.5–4.5)
Glucose: 97 mg/dL (ref 70–99)
Potassium: 4.1 mmol/L (ref 3.5–5.2)
Sodium: 141 mmol/L (ref 134–144)
Total Protein: 6.6 g/dL (ref 6.0–8.5)
eGFR: 90 mL/min/{1.73_m2} (ref >59)

## 2022-08-21 LAB — LIPID PANEL
Chol/HDL Ratio: 3.1 ratio (ref 0.0–4.4)
Cholesterol, Total: 248 mg/dL — ABNORMAL HIGH (ref 100–199)
HDL: 79 mg/dL (ref >39)
LDL Chol Calc (NIH): 147 mg/dL — ABNORMAL HIGH (ref 0–99)
Triglycerides: 124 mg/dL (ref 0–149)
VLDL Cholesterol Cal: 22 mg/dL (ref 5–40)

## 2022-08-21 NOTE — Progress Notes (Signed)
    CHIEF COMPLAINT / HPI: Well checkup.   PERTINENT  PMH / PSH: I have reviewed the patient's medications, allergies, past medical and surgical history, smoking status and updated in the EMR as appropriate.   OBJECTIVE:  BP (!) 133/55   Pulse (!) 56   Ht '5\' 1"'$  (1.549 m)   Wt 130 lb 9.6 oz (59.2 kg)   SpO2 99%   BMI 24.68 kg/m   Vital signs reviewed. GENERAL: Well-developed, well-nourished, no acute distress. CARDIOVASCULAR: Regular rate and rhythm no murmur gallop or rub LUNGS: Clear to auscultation bilaterally, no rales or wheeze. ABDOMEN: Soft positive bowel sounds NEURO: No gross focal neurological deficits. MSK: Movement of extremity x 4.  ASSESSMENT / PLAN:   Well woman exam Up-to-date on health maintenance.  We will update immunizations when available.  Refilled some meds and will check some labs today.  She is overall doing quite well.   Dorcas Mcmurray MD

## 2022-08-21 NOTE — Assessment & Plan Note (Signed)
>>  ASSESSMENT AND PLAN FOR WELL WOMAN EXAM WRITTEN ON 08/21/2022 11:59 AM BY Romeka Scifres L, MD  Up-to-date on health maintenance.  We will update immunizations when available.  Refilled some meds and will check some labs today.  She is overall doing quite well.

## 2022-08-21 NOTE — Assessment & Plan Note (Signed)
Up-to-date on health maintenance.  We will update immunizations when available.  Refilled some meds and will check some labs today.  She is overall doing quite well.

## 2022-09-05 ENCOUNTER — Encounter: Payer: Self-pay | Admitting: Family Medicine

## 2022-09-05 ENCOUNTER — Other Ambulatory Visit: Payer: Self-pay | Admitting: Family Medicine

## 2022-09-05 MED ORDER — TRAMADOL HCL 50 MG PO TABS: ORAL_TABLET | ORAL | 5 refills | Status: DC

## 2022-09-08 DIAGNOSIS — L821 Other seborrheic keratosis: Secondary | ICD-10-CM | POA: Diagnosis not present

## 2022-09-08 DIAGNOSIS — D0410 Carcinoma in situ of skin of unspecified eyelid, including canthus: Secondary | ICD-10-CM | POA: Diagnosis not present

## 2022-10-15 ENCOUNTER — Telehealth: Payer: Self-pay

## 2022-10-15 NOTE — Patient Outreach (Signed)
  Care Coordination   10/15/2022 Name: ANNDREA MIHELICH MRN: [id number] DOB: 08-Jun-1941   Care Coordination Outreach Attempts:  A second unsuccessful outreach was attempted today to offer the patient with information about available care coordination services as a benefit of their health plan.     Follow Up Plan:  Additional outreach attempts will be made to offer the patient care coordination information and services.   Encounter Outcome:  No Answer  Care Coordination Interventions Activated:  No   Care Coordination Interventions:  No, not indicated    Jone Baseman, RN, MSN [facility] Care Management Coordinator Direct Line [phone]

## 2022-11-03 ENCOUNTER — Telehealth: Payer: Self-pay

## 2022-11-03 NOTE — Patient Outreach (Signed)
  Care Coordination   11/03/2022 Name: Margaret Barton MRN: [id number] DOB: 09-17-1941   Care Coordination Outreach Attempts:  A third unsuccessful outreach was attempted today to offer the patient with information about available care coordination services as a benefit of their health plan.   Follow Up Plan:  No further outreach attempts will be made at this time. We have been unable to contact the patient to offer or enroll patient in care coordination services  Encounter Outcome:  No Answer  Care Coordination Interventions Activated:  No   Care Coordination Interventions:  No, not indicated    Jone Baseman, RN, MSN [place] [facility] Care Management Coordinator Direct Line [phone]

## 2022-11-17 DIAGNOSIS — C441222 Squamous cell carcinoma of skin of right lower eyelid, including canthus: Secondary | ICD-10-CM | POA: Diagnosis not present

## 2022-11-18 ENCOUNTER — Other Ambulatory Visit: Payer: Self-pay | Admitting: Sports Medicine

## 2022-11-18 ENCOUNTER — Other Ambulatory Visit (HOSPITAL_COMMUNITY): Payer: Self-pay

## 2022-11-18 MED ORDER — HYDROCODONE BIT-HOMATROP MBR 5-1.5 MG/5ML PO SOLN
5.0000 mL | Freq: Four times a day (QID) | ORAL | 0 refills | Status: DC | PRN
  Filled 2022-11-18: qty 120, 6d supply, fill #0

## 2022-11-18 MED ORDER — HYDROCODONE BIT-HOMATROP MBR 5-1.5 MG/5ML PO SOLN: 5.0000 mL | Freq: Four times a day (QID) | ORAL | Status: DC | PRN

## 2022-11-21 ENCOUNTER — Ambulatory Visit
Admission: RE | Admit: 2022-11-21 | Discharge: 2022-11-21 | Disposition: A | Discharge status: Home / Self Care | Payer: Medicare PPO | Source: Ambulatory Visit | Attending: Family Medicine | Admitting: Family Medicine

## 2022-11-21 ENCOUNTER — Ambulatory Visit (INDEPENDENT_AMBULATORY_CARE_PROVIDER_SITE_OTHER): Payer: Medicare PPO | Admitting: Family Medicine

## 2022-11-21 VITALS — BP 110/68 | Ht 61.0 in | Wt 130.0 lb

## 2022-11-21 DIAGNOSIS — M25511 Pain in right shoulder: Secondary | ICD-10-CM

## 2022-11-21 DIAGNOSIS — R052 Subacute cough: Secondary | ICD-10-CM | POA: Diagnosis not present

## 2022-11-21 DIAGNOSIS — R059 Cough, unspecified: Secondary | ICD-10-CM | POA: Diagnosis not present

## 2022-11-21 MED ORDER — PREDNISONE 50 MG PO TABS: ORAL_TABLET | ORAL | 0 refills | Status: DC

## 2022-11-21 MED ORDER — METHYLPREDNISOLONE ACETATE 40 MG/ML IJ SUSP
40.0000 mg | Freq: Once | INTRAMUSCULAR | Status: AC
  Administered 2022-11-21: 40 mg via INTRA_ARTICULAR

## 2022-11-21 NOTE — Progress Notes (Signed)
  Margaret Barton - 81 y.o. female MRN [id number]  Date of birth: [birth year]    SUBJECTIVE:      Chief Complaint:/ HPI:   1. Right posterior shoulder pain with a knot noted in soft tissue of shoulder muscle. Having pain and stiffness on overhead extension of that side. On gong several weeks. No known injury 2. Cough, now 6-8 weeks. Had respiratory infection and that has seemed to clear as she is no longer coughing up green mucous but cough will not seem to go away. Cough was keeping her awake until dr. Micheline Chapman gave her some cough medicine and that has helped ;ast 2 nights. No unusual SOB.  Feels a bit tired (but also had not been sleeping well)    OBJECTIVE: BP 110/68   Ht '5\' 1"'$  (1.549 m)   Wt 130 lb (59 kg)   BMI 24.56 kg/m   Physical Exam:  Vital signs are reviewed. Gen : WD WN NAD LUNGS: slight expiratory wheezes which improved /cleared with cough noted on left mid lung field. Good air movement. Cough sounds rhonchorus but no rales. CWC:BJSEG soft tissue knot in right trapezius. TTP muscle at superior scapula insertion levator sscapula. ROM shoulder limited in overhead extension by pain but passively almost full. Rotator cuff muscle strength intact.  PROCEDURE: INJECTION: Patient was given informed consent, signed copy in the chart. Appropriate time out was taken. Area prepped and draped in usual sterile fashion. Ethyl chloride was  used for local anesthesia. A 21 gauge 1 1/2 inch needle was used.. 1/2 cc of methylprednisolone 40 mg/ml plus  1/2 cc of 1% lidocaine without epinephrine was injected into the right posterior trapezius muscle at area of soft tissue know and also at insertion levator scapula using a(n) perpendicular approach.   The patient tolerated the procedure well. There were no complications. Post procedure instructions were given.   ASSESSMENT & PLAN: Cough (post infectious). Will get CXR as precaution in case loculated infection or other pathology.. I think this is post  infectious tussive syndrome and will treat with 5 days prednisone. Can continue night cough suppressant, Should be significantly improved by early next week and will let me know if she is not, or if she has any new or worsening sx. 2. Shoulder pain and muscle spasm: trigger point injection. ROM exercises. If not totally better 7 days may need RTC. NOTE: I called her with my read of her CXR (negative for acute pathology) and will await final read. She said her shoulder was already significantly better. I told her that was probably the numbing medicine! See problem based charting & AVS for pt instructions. No problem-specific Assessment & Plan notes found for this encounter.

## 2022-11-24 ENCOUNTER — Other Ambulatory Visit: Payer: Self-pay | Admitting: Family Medicine

## 2022-11-24 MED ORDER — HYDROCODONE BIT-HOMATROP MBR 5-1.5 MG/5ML PO SOLN: 5.0000 mL | Freq: Four times a day (QID) | ORAL | 0 refills | Status: DC | PRN

## 2022-11-24 NOTE — Progress Notes (Signed)
Phone call Shoulder 100% better Feel ing some better overall but cough still bothering her at nightand she ran out of cough medicine last evening. Would like a refill. I will call in and if she is not completely better in a few more days she will let me know Dorcas Mcmurray ling better and cough is better but still bo

## 2022-12-08 ENCOUNTER — Other Ambulatory Visit: Payer: Self-pay

## 2022-12-08 DIAGNOSIS — M25572 Pain in left ankle and joints of left foot: Secondary | ICD-10-CM

## 2022-12-09 ENCOUNTER — Ambulatory Visit: Payer: Medicare PPO | Admitting: Sports Medicine

## 2022-12-09 ENCOUNTER — Ambulatory Visit (INDEPENDENT_AMBULATORY_CARE_PROVIDER_SITE_OTHER): Payer: Medicare PPO | Admitting: Sports Medicine

## 2022-12-09 ENCOUNTER — Ambulatory Visit
Admission: RE | Admit: 2022-12-09 | Discharge: 2022-12-09 | Disposition: A | Discharge status: Home / Self Care | Payer: Medicare PPO | Source: Ambulatory Visit | Attending: Sports Medicine | Admitting: Sports Medicine

## 2022-12-09 VITALS — BP 110/82 | Ht 61.0 in | Wt 130.0 lb

## 2022-12-09 DIAGNOSIS — M25572 Pain in left ankle and joints of left foot: Secondary | ICD-10-CM | POA: Diagnosis not present

## 2022-12-09 MED ORDER — AZITHROMYCIN 250 MG PO TABS: ORAL_TABLET | ORAL | 0 refills | Status: DC

## 2022-12-09 MED ORDER — HYDROCODONE BIT-HOMATROP MBR 5-1.5 MG/5ML PO SOLN: 5.0000 mL | Freq: Four times a day (QID) | ORAL | 0 refills | Status: DC | PRN

## 2022-12-09 NOTE — Progress Notes (Signed)
   Subjective:    Patient ID: Margaret Barton, female    DOB: 10/19/1941, 81 y.o.   MRN: [id number]  HPI chief complaint: Left ankle pain  Margaret Barton presents today complaining of lateral left ankle pain that began after an inversion injury 4 days ago.  She had immediate pain and developed some swelling and bruising thereafter.  She has been using a cane to help assist with her ambulation.  She has only mild pain along the medial ankle.  No pain in the foot.  She is here today with her husband.   Review of Systems As above    Objective:   Physical Exam  Well-developed, well-nourished.  No acute distress  Left ankle: Good ankle range of motion.  There is moderate soft tissue swelling over the lateral malleolus and there is tenderness to palpation directly over the distal fibula.  No tenderness along the medial ankle.  Ankle is stable to ligamentous exam.  Neurovascularly intact distally.  She is ambulating with a slight antalgic gait but seems to ambulate easily with the assistance of a 4-prong cane.  X-rays of the left ankle including AP, lateral, and oblique views show a nondisplaced distal fibular fracture below the level of the mortise.  This is best seen on the oblique films.  No other fractures are seen.  Please note that the radiology interpretation was pending at the time of this dictation.      Assessment & Plan:   Nondisplaced distal fibula fracture, left ankle  We will treat this with a Aircast.  She may continue with her 4-prong cane and will follow-up with me again in 3 weeks.  We will plan on repeating an x-ray at that time.  She will let me know if she has questions or concerns in the interim.  Of note, she has been dealing with an upper respiratory illness for several weeks.  Dr. Nori Riis recently put her on some steroids which was helpful but her cough is now returned.  I recommended that we trial a Z-Pak and I will refill her cough medicine.  If symptoms do not improve over the next  week or 2 then she will need to follow-up with Dr. Nori Riis.  This note was dictated using Dragon naturally speaking software and may contain errors in syntax, spelling, or content which have not been identified prior to signing this note.

## 2022-12-17 NOTE — Patient Instructions (Signed)
Margaret Barton , Thank you for taking time to come for your Medicare Wellness Visit. I appreciate your ongoing commitment to your health goals. Please review the following plan we discussed and let me know if I can assist you in the future.   These are the goals we discussed:  Goals      Weight < 140 lb (63.504 kg)        This is a list of the screening recommended for you and due dates:  Health Maintenance  Topic Date Due   Medicare Annual Wellness Visit  Never done   Hepatitis C Screening: USPSTF Recommendation to screen - Ages 12-79 yo.  Never done   Zoster (Shingles) Vaccine (1 of 2) Never done   Flu Shot  07/22/2022   COVID-19 Vaccine (4 - 2023-24 season) 08/22/2022   DTaP/Tdap/Td vaccine (4 - Td or Tdap) 08/10/2024   Pneumonia Vaccine  Completed   DEXA scan (bone density measurement)  Completed   HPV Vaccine  Aged Out   Colon Cancer Screening  Discontinued    Advanced directives: ***  Conditions/risks identified: ***  Next appointment: Follow up in one year for your annual wellness visit ***   Preventive Care 65 Years and Older, Female Preventive care refers to lifestyle choices and visits with your health care provider that can promote health and wellness. What does preventive care include? A yearly physical exam. This is also called an annual well check. Dental exams once or twice a year. Routine eye exams. Ask your health care provider how often you should have your eyes checked. Personal lifestyle choices, including: Daily care of your teeth and gums. Regular physical activity. Eating a healthy diet. Avoiding tobacco and drug use. Limiting alcohol use. Practicing safe sex. Taking low-dose aspirin every day. Taking vitamin and mineral supplements as recommended by your health care provider. What happens during an annual well check? The services and screenings done by your health care provider during your annual well check will depend on your age, overall health,  lifestyle risk factors, and family history of disease. Counseling  Your health care provider may ask you questions about your: Alcohol use. Tobacco use. Drug use. Emotional well-being. Home and relationship well-being. Sexual activity. Eating habits. History of falls. Memory and ability to understand (cognition). Work and work Statistician. Reproductive health. Screening  You may have the following tests or measurements: Height, weight, and BMI. Blood pressure. Lipid and cholesterol levels. These may be checked every 5 years, or more frequently if you are over 46 years old. Skin check. Lung cancer screening. You may have this screening every year starting at age 54 if you have a 30-pack-year history of smoking and currently smoke or have quit within the past 15 years. Fecal occult blood test (FOBT) of the stool. You may have this test every year starting at age 14. Flexible sigmoidoscopy or colonoscopy. You may have a sigmoidoscopy every 5 years or a colonoscopy every 10 years starting at age 72. Hepatitis C blood test. Hepatitis B blood test. Sexually transmitted disease (STD) testing. Diabetes screening. This is done by checking your blood sugar (glucose) after you have not eaten for a while (fasting). You may have this done every 1-3 years. Bone density scan. This is done to screen for osteoporosis. You may have this done starting at age 20. Mammogram. This may be done every 1-2 years. Talk to your health care provider about how often you should have regular mammograms. Talk with your health care provider  about your test results, treatment options, and if necessary, the need for more tests. Vaccines  Your health care provider may recommend certain vaccines, such as: Influenza vaccine. This is recommended every year. Tetanus, diphtheria, and acellular pertussis (Tdap, Td) vaccine. You may need a Td booster every 10 years. Zoster vaccine. You may need this after age  61. Pneumococcal 13-valent conjugate (PCV13) vaccine. One dose is recommended after age 18. Pneumococcal polysaccharide (PPSV23) vaccine. One dose is recommended after age 22. Talk to your health care provider about which screenings and vaccines you need and how often you need them. This information is not intended to replace advice given to you by your health care provider. Make sure you discuss any questions you have with your health care provider. Document Released: 01/04/2016 Document Revised: 08/27/2016 Document Reviewed: 10/09/2015 Elsevier Interactive Patient Education  2017 [place] Prevention in the Home Falls can cause injuries. They can happen to people of all ages. There are many things you can do to make your home safe and to help prevent falls. What can I do on the outside of my home? Regularly fix the edges of walkways and driveways and fix any cracks. Remove anything that might make you trip as you walk through a door, such as a raised step or threshold. Trim any bushes or trees on the path to your home. Use bright outdoor lighting. Clear any walking paths of anything that might make someone trip, such as rocks or tools. Regularly check to see if handrails are loose or broken. Make sure that both sides of any steps have handrails. Any raised decks and porches should have guardrails on the edges. Have any leaves, snow, or ice cleared regularly. Use sand or salt on walking paths during winter. Clean up any spills in your garage right away. This includes oil or grease spills. What can I do in the bathroom? Use night lights. Install grab bars by the toilet and in the tub and shower. Do not use towel bars as grab bars. Use non-skid mats or decals in the tub or shower. If you need to sit down in the shower, use a plastic, non-slip stool. Keep the floor dry. Clean up any water that spills on the floor as soon as it happens. Remove soap buildup in the tub or shower  regularly. Attach bath mats securely with double-sided non-slip rug tape. Do not have throw rugs and other things on the floor that can make you trip. What can I do in the bedroom? Use night lights. Make sure that you have a light by your bed that is easy to reach. Do not use any sheets or blankets that are too big for your bed. They should not hang down onto the floor. Have a firm chair that has side arms. You can use this for support while you get dressed. Do not have throw rugs and other things on the floor that can make you trip. What can I do in the kitchen? Clean up any spills right away. Avoid walking on wet floors. Keep items that you use a lot in easy-to-reach places. If you need to reach something above you, use a strong step stool that has a grab bar. Keep electrical cords out of the way. Do not use floor polish or wax that makes floors slippery. If you must use wax, use non-skid floor wax. Do not have throw rugs and other things on the floor that can make you trip. What can I do  with my stairs? Do not leave any items on the stairs. Make sure that there are handrails on both sides of the stairs and use them. Fix handrails that are broken or loose. Make sure that handrails are as long as the stairways. Check any carpeting to make sure that it is firmly attached to the stairs. Fix any carpet that is loose or worn. Avoid having throw rugs at the top or bottom of the stairs. If you do have throw rugs, attach them to the floor with carpet tape. Make sure that you have a light switch at the top of the stairs and the bottom of the stairs. If you do not have them, ask someone to add them for you. What else can I do to help prevent falls? Wear shoes that: Do not have high heels. Have rubber bottoms. Are comfortable and fit you well. Are closed at the toe. Do not wear sandals. If you use a stepladder: Make sure that it is fully opened. Do not climb a closed stepladder. Make sure that  both sides of the stepladder are locked into place. Ask someone to hold it for you, if possible. Clearly mark and make sure that you can see: Any grab bars or handrails. First and last steps. Where the edge of each step is. Use tools that help you move around (mobility aids) if they are needed. These include: Canes. Walkers. Scooters. Crutches. Turn on the lights when you go into a dark area. Replace any light bulbs as soon as they burn out. Set up your furniture so you have a clear path. Avoid moving your furniture around. If any of your floors are uneven, fix them. If there are any pets around you, be aware of where they are. Review your medicines with your doctor. Some medicines can make you feel dizzy. This can increase your chance of falling. Ask your doctor what other things that you can do to help prevent falls. This information is not intended to replace advice given to you by your health care provider. Make sure you discuss any questions you have with your health care provider. Document Released: 10/04/2009 Document Revised: 05/15/2016 Document Reviewed: 01/12/2015 Elsevier Interactive Patient Education  2017 [employer].

## 2022-12-18 ENCOUNTER — Ambulatory Visit (INDEPENDENT_AMBULATORY_CARE_PROVIDER_SITE_OTHER): Payer: Medicare PPO

## 2022-12-18 VITALS — Ht 61.0 in | Wt 130.0 lb

## 2022-12-18 DIAGNOSIS — Z0000 Encounter for general adult medical examination without abnormal findings: Secondary | ICD-10-CM | POA: Diagnosis not present

## 2022-12-18 NOTE — Progress Notes (Signed)
I connected with  Margaret Barton on 12/18/22 by a audio enabled telemedicine application and verified that I am speaking with the correct person using two identifiers.  Patient Location: Home  Provider Location: Home Office  I discussed the limitations of evaluation and management by telemedicine. The patient expressed understanding and agreed to proceed. Subjective:   Margaret Barton is a 81 y.o. female who presents for Medicare Annual (Subsequent) preventive examination.  Review of Systems    Defer to provider       Objective:    Today's Vitals   12/17/22 1618  Weight: 130 lb (59 kg)  Height: '5\' 1"'$  (1.549 m)   Body mass index is 24.56 kg/m.     12/18/2022    8:09 AM 08/20/2022    9:20 AM 08/14/2021    8:32 AM 04/03/2021    8:30 AM 12/05/2020    9:51 AM 08/08/2020    8:53 AM 04/27/2020   10:22 AM  Advanced Directives  Does Patient Have a Medical Advance Directive? Yes No No Yes No No Yes  Type of Primary school teacher of [place];Living will  Does patient want to make changes to medical advance directive?       No - Patient declined  Copy of [place] in Chart? No - copy requested   No - copy requested   Yes - validated most recent copy scanned in chart (See row information)  Would patient like information on creating a medical advance directive?   No - Patient declined  No - Patient declined No - Patient declined     Current Medications (verified) Outpatient Encounter Medications as of 12/18/2022  Medication Sig   citalopram (CELEXA) 20 MG tablet Take 1 tablet (20 mg total) by mouth daily.   Diclofenac-miSOPROStol 75-0.2 MG TBEC Take 1 tablet by mouth 2 (two) times daily as needed. for pain   estradiol (ESTRADERM) 0.1 MG/24HR patch Place 1 patch (0.1 mg total) onto the skin 2 (two) times a week.   fluorouracil (EFUDEX) 5 % cream APP TOPICALLY AA QD MON THROUGH FRI FOR 6 WKS    gabapentin (NEURONTIN) 300 MG capsule Taper up to 3 capsules at bedtime and patient has written taper   lidocaine (XYLOCAINE) 5 % ointment Apply 1 application topically 3 (three) times daily as needed.   simvastatin (ZOCOR) 40 MG tablet TAKE 1 TABLET (40 MG TOTAL) BY MOUTH AT BEDTIME.   traMADol (ULTRAM) 50 MG tablet Take one or two tablets three times a day as needed for pain max of 6 tabs in 24 hours   [DISCONTINUED] HYDROcodone bit-homatropine (HYCODAN) 5-1.5 MG/5ML syrup Take 5 mLs by mouth every 6 (six) hours as needed for cough.   [DISCONTINUED] predniSONE (DELTASONE) 50 MG tablet Take one by mouth daily for 5 days   [DISCONTINUED] azithromycin (ZITHROMAX Z-PAK) 250 MG tablet Take 2 pills the first day. Then take 1 pill a day for the next 4 days.   No facility-administered encounter medications on file as of 12/18/2022.    Allergies (verified) Keflex [cephalexin]   History: No past medical history on file. Past Surgical History:  Procedure Laterality Date   ABDOMINAL HYSTERECTOMY     TOTAL HIP ARTHROPLASTY     Right   WRIST SURGERY Right    Family History  Problem Relation Age of Onset   Diabetes Mother    COPD Father  smoker   Social History   Socioeconomic History   Marital status: Married    Spouse name: Clair Gulling   Number of children: 2   Years of education: Not on file   Highest education level: Not on file  Occupational History   Occupation: Professor    Employer: 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 Dorcas Mcmurray   End of Life Plan:    Who lives with  you: Lives with husband on 64 acres of property   Any pets: 1 german shepard, 3 cats   Diet: Patient has a varied diet of protein, starch, and fruits and vegetables   Exercise: Patient exercises 4-5 times a week.   Seatbelts: Patient reports wearing seat belt when in vehicle.   Nancy Fetter Exposure/Protection: Patient reports wear sun screen daily.   Hobbies: Gardening, sports, teaching, cooking            Social Determinants of Health   Financial Resource Strain: Low Risk  (12/18/2022)   Overall Financial Resource Strain (CARDIA)    Difficulty of Paying Living Expenses: Not hard at all  Food Insecurity: No Food Insecurity (12/18/2022)   Hunger Vital Sign    Worried About Running Out of Food in the Last Year: Never true    Ran Out of Food in the Last Year: Never true  Transportation Needs: No Transportation Needs (12/18/2022)   PRAPARE - Hydrologist (Medical): No    Lack of Transportation (Non-Medical): No  Physical Activity: Sufficiently Active (12/18/2022)   Exercise Vital Sign    Days of Exercise per Week: 7 days    Minutes of Exercise per Session: 30 min  Stress: No Stress Concern Present (12/18/2022)   [place]    Feeling of Stress : Not at all  Social Connections: [place] (12/18/2022)   Social Connection and Isolation Panel [NHANES]    Frequency of Communication with Friends and Family: More than three times a week    Frequency of Social Gatherings with Friends and Family: More than three times a week    Attends Religious Services: More than 4 times per year    Active Member of [employer] or Organizations: Yes    Attends Archivist Meetings: 1 to 4 times per year    Marital Status: Married    Tobacco Counseling Counseling given: Not Answered   Clinical Intake:  Pre-visit preparation completed: Yes  Pain : No/denies pain     BMI - recorded:  24.56 Nutritional Status: BMI 25 -29 Overweight Diabetes: No  How often do you need to have someone help you when you read instructions, pamphlets, or other written materials from your doctor or pharmacy?: 1 - Never What is the last grade level you completed in school?: masters  Diabetic?no  Interpreter Needed?: No  Information entered by :: S.Devesh Monforte, CMA   Activities of Daily Living    12/18/2022    8:10 AM  In your  present state of health, do you have any difficulty performing the following activities:  Hearing? 0  Vision? 0  Difficulty concentrating or making decisions? 0  Walking or climbing stairs? 0  Dressing or bathing? 0  Doing errands, shopping? 0  Preparing Food and eating ? N  Using the Toilet? N  In the past six months, have you accidently leaked urine? N  Do you have problems with loss of bowel control? N  Managing your Medications? N  Managing your Finances? N  Housekeeping or managing your Housekeeping? N    Patient Care Team: Dickie La, MD as PCP - General Edwyna Ready Heinz Knuckles., MD (Obstetrics and Gynecology) Dr. Rolm Baptise (Dentistry) Dr. Janna Arch (Dermatology) Dr. Winfield Rast [facility])  Indicate any recent Medical Services you may have received from other than [facility] providers in the past year (date may be approximate).     Assessment:   This is a routine wellness examination for [facility].  Hearing/Vision screen Hearing Screening - Comments:: No concerns Vision Screening - Comments:: Wears reading glasses/last eye exam 03/2022/Dr Simon in [place] issues and exercise activities discussed: Current Exercise Habits: Home exercise routine   Goals Addressed             This Visit's Progress    Prevent Falls and Broken Bones-Osteoporosis         Why is this important?   When you fall, there are 3 things that control if a bone breaks or not.  These are the fall itself, how hard and the direction that you fall and how fragile your  bones are.  Preventing falls is very important for you because of fragile bones.     Notes:        Depression Screen    12/18/2022    8:06 AM 08/20/2022    9:20 AM 08/14/2021    8:44 AM 04/03/2021    8:32 AM 12/05/2020    9:51 AM 08/08/2020    9:49 AM 07/13/2019    8:59 AM  PHQ 2/9 Scores  PHQ - 2 Score 0 0 '1 1 1 '$ 0 0  PHQ- 9 Score  '3 6 3 5 1     '$ Fall Risk    12/18/2022    8:09 AM 04/03/2021    8:30 AM 12/05/2020    9:50 AM 08/08/2020    8:53 AM 07/13/2019    8:59 AM  Fall Risk   Falls in the past year? 1 0 0 0 0  Number falls in past yr: 0      Injury with Fall? 1        FALL RISK PREVENTION PERTAINING TO THE HOME:  Any stairs in or around the home? Yes  If so, are there any without handrails? Yes  Home free of loose throw rugs in walkways, pet beds, electrical cords, etc? Yes  Adequate lighting in your home to reduce risk of falls? Yes   ASSISTIVE DEVICES UTILIZED TO PREVENT FALLS:  Life alert? No  Use of a cane, walker or w/c? No  Grab bars in the bathroom? No  Shower chair or bench in shower? No  Elevated toilet seat or a handicapped toilet? No   Cognitive Function:    04/14/2011    3:00 PM  MMSE - Mini Mental State Exam  Orientation to time 5  Orientation to Place 5  Registration 3  Attention/ Calculation 5  Recall 3  Language- name 2 objects 2  Language- repeat 1  Language- follow  3 step command 3  Language- read & follow direction 1  Write a sentence 1  Copy design 1  Total score 30        Immunizations Immunization History  Administered Date(s) Administered   Fluad Quad(high Dose 65+) 09/07/2019   Influenza Split 10/08/2011   Influenza Whole 10/03/2009   Influenza, High Dose Seasonal PF 09/17/2017, 09/07/2019   Influenza,inj,Quad PF,6+ Mos 01/25/2014, 11/06/2015   Influenza-Unspecified 10/17/2010, 01/25/2014, 11/06/2015, 11/03/2016, 09/17/2017, 09/07/2019, 09/21/2022   PFIZER(Purple Top)SARS-COV-2 Vaccination 12/30/2019, 01/19/2020,  05/27/2021   Pneumococcal Conjugate-13 06/20/2015   Pneumococcal Polysaccharide-23 02/13/2009   Td 08/22/1993, 02/13/2009   Td (Adult),5 Lf Tetanus Toxid, Preservative Free 08/22/1993, 02/13/2009   Tdap 08/10/2014    TDAP status: Up to date  Flu Vaccine status: Up to date  Pneumococcal vaccine status: Up to date  Covid-19 vaccine status: Completed vaccines  Qualifies for Shingles Vaccine?  Had shingles   Zostavax completed No   Shingrix Completed?: No.    Education has been provided regarding the importance of this vaccine. Patient has been advised to call insurance company to determine out of pocket expense if they have not yet received this vaccine. Advised may also receive vaccine at local pharmacy or Health Dept. Verbalized acceptance and understanding.  Screening Tests Health Maintenance  Topic Date Due   Medicare Annual Wellness (AWV)  Never done   Hepatitis C Screening  Never done   Zoster Vaccines- Shingrix (1 of 2) Never done   COVID-19 Vaccine (4 - 2023-24 season) 08/22/2022   DTaP/Tdap/Td (6 - Td or Tdap) 08/10/2024   Pneumonia Vaccine 86+ Years old  Completed   INFLUENZA VACCINE  Completed   DEXA SCAN  Completed   HPV VACCINES  Aged Out   COLONOSCOPY (Pts 45-65yr Insurance coverage will need to be confirmed)  Discontinued    Health Maintenance  Health Maintenance Due  Topic Date Due   Medicare Annual Wellness (AWV)  Never done   Hepatitis C Screening  Never done   Zoster Vaccines- Shingrix (1 of 2) Never done   COVID-19 Vaccine (4 - 2023-24 season) 08/22/2022    Colorectal cancer screening: Type of screening: Colonoscopy. Completed 03/2015. Repeat every   years  Mammogram status: No longer required due to  .    Lung Cancer Screening: (Low Dose CT Chest recommended if Age 79-80years, 30 pack-year currently smoking OR have quit w/in 15years.) does not qualify.   Lung Cancer Screening Referral: n/a  Additional Screening:  Hepatitis C Screening: does  not qualify;  Vision Screening: Recommended annual ophthalmology exams for early detection of glaucoma and other disorders of the eye. Is the patient up to date with their annual eye exam?  Yes  Who is the provider or what is the name of the office in which the patient attends annual eye exams? Dr [place]ilberto [place] pt is not established with a provider, would they like to be referred to a provider to establish care? No .   Dental Screening: Recommended annual dental exams for proper oral hygiene  Community Resource Referral / Chronic Care Management: CRR required this visit?  No   CCM required this visit?  No      Plan:     I have personally reviewed and noted the following in the patient's chart:   Medical and social history Use of alcohol, tobacco or illicit drugs  Current medications and supplements including opioid prescriptions. Patient is not currently taking opioid prescriptions. Functional ability and status Nutritional  status Physical activity Advanced directives List of other physicians Hospitalizations, surgeries, and ER visits in previous 12 months Vitals Screenings to include cognitive, depression, and falls Referrals and appointments  In addition, I have reviewed and discussed with patient certain preventive protocols, quality metrics, and best practice recommendations. A written personalized care plan for preventive services as well as general preventive health recommendations were provided to patient.     Amado Coe, [place]   12/18/2022

## 2022-12-29 ENCOUNTER — Ambulatory Visit
Admission: RE | Admit: 2022-12-29 | Discharge: 2022-12-29 | Disposition: A | Discharge status: Home / Self Care | Payer: Medicare PPO | Source: Ambulatory Visit | Attending: Sports Medicine | Admitting: Sports Medicine

## 2022-12-29 ENCOUNTER — Ambulatory Visit: Payer: Medicare PPO | Admitting: Sports Medicine

## 2022-12-29 ENCOUNTER — Ambulatory Visit (INDEPENDENT_AMBULATORY_CARE_PROVIDER_SITE_OTHER): Payer: Medicare PPO | Admitting: Sports Medicine

## 2022-12-29 VITALS — BP 110/82 | Ht 61.0 in | Wt 130.0 lb

## 2022-12-29 DIAGNOSIS — S82832D Other fracture of upper and lower end of left fibula, subsequent encounter for closed fracture with routine healing: Secondary | ICD-10-CM | POA: Diagnosis not present

## 2022-12-29 DIAGNOSIS — M25572 Pain in left ankle and joints of left foot: Secondary | ICD-10-CM

## 2022-12-29 NOTE — Progress Notes (Signed)
   Subjective:    Patient ID: Margaret Barton, female    DOB: 1941/05/04, 82 y.o.   MRN: [id number]  HPI  Margaret Barton presents today for follow-up on a nondisplaced left distal fibular avulsion fracture.  She is doing well.  She is able to ambulate on flat surfaces without pain.  She does still endorse pain with going up and down stairs.  Her Aircast is comfortable.    Review of Systems As above    Objective:   Physical Exam  Well-developed, well-nourished.  No acute distress  Left ankle: She has some slightly limited range of motion compared to the right ankle.  Still some tenderness to palpation over the distal fibula.  No effusion.  No soft tissue swelling.  Good ankle stability.  Good pulses.  Ambulating without an antalgic gait.  X-rays of the left ankle including AP, lateral, and mortise views show no change in fracture position and some early callus consistent with a subacute fracture at the distal left fibula.      Assessment & Plan:   Healing left distal fibular avulsion fracture  Margaret Barton is doing very well clinically.  I do not think we need any further x-rays.  She will start strengthening and range of motion exercises as tolerated.  I do recommend that she wear her Aircast for the next several weeks when walking on uneven surfaces such as in her garden.  Otherwise, I think she is okay to start to increase activity as tolerated and will follow-up with me as needed.  This note was dictated using Dragon naturally speaking software and may contain errors in syntax, spelling, or content which have not been identified prior to signing this note.

## 2023-01-12 DIAGNOSIS — M8589 Other specified disorders of bone density and structure, multiple sites: Secondary | ICD-10-CM | POA: Diagnosis not present

## 2023-01-12 DIAGNOSIS — Z780 Asymptomatic menopausal state: Secondary | ICD-10-CM | POA: Diagnosis not present

## 2023-01-12 DIAGNOSIS — Z87310 Personal history of (healed) osteoporosis fracture: Secondary | ICD-10-CM | POA: Diagnosis not present

## 2023-02-17 ENCOUNTER — Other Ambulatory Visit: Payer: Self-pay | Admitting: Family Medicine

## 2023-02-23 DIAGNOSIS — Z01419 Encounter for gynecological examination (general) (routine) without abnormal findings: Secondary | ICD-10-CM | POA: Diagnosis not present

## 2023-03-24 DIAGNOSIS — H25813 Combined forms of age-related cataract, bilateral: Secondary | ICD-10-CM | POA: Diagnosis not present

## 2023-03-24 DIAGNOSIS — H04123 Dry eye syndrome of bilateral lacrimal glands: Secondary | ICD-10-CM | POA: Diagnosis not present

## 2023-05-16 ENCOUNTER — Other Ambulatory Visit: Payer: Self-pay | Admitting: Family Medicine

## 2023-07-22 DIAGNOSIS — Z86008 Personal history of in-situ neoplasm of other site: Secondary | ICD-10-CM | POA: Diagnosis not present

## 2023-07-22 DIAGNOSIS — L648 Other androgenic alopecia: Secondary | ICD-10-CM | POA: Diagnosis not present

## 2023-07-22 DIAGNOSIS — Z129 Encounter for screening for malignant neoplasm, site unspecified: Secondary | ICD-10-CM | POA: Diagnosis not present

## 2023-07-22 DIAGNOSIS — L570 Actinic keratosis: Secondary | ICD-10-CM | POA: Diagnosis not present

## 2023-07-22 DIAGNOSIS — Z859 Personal history of malignant neoplasm, unspecified: Secondary | ICD-10-CM | POA: Diagnosis not present

## 2023-08-26 ENCOUNTER — Ambulatory Visit (INDEPENDENT_AMBULATORY_CARE_PROVIDER_SITE_OTHER): Payer: Medicare PPO | Admitting: Sports Medicine

## 2023-08-26 ENCOUNTER — Ambulatory Visit: Payer: Self-pay

## 2023-08-26 ENCOUNTER — Ambulatory Visit (INDEPENDENT_AMBULATORY_CARE_PROVIDER_SITE_OTHER): Payer: Medicare PPO | Admitting: Family Medicine

## 2023-08-26 ENCOUNTER — Encounter: Payer: Self-pay | Admitting: Family Medicine

## 2023-08-26 ENCOUNTER — Ambulatory Visit
Admission: RE | Admit: 2023-08-26 | Discharge: 2023-08-26 | Disposition: A | Discharge status: Home / Self Care | Payer: Medicare PPO | Source: Ambulatory Visit | Attending: Sports Medicine | Admitting: Sports Medicine

## 2023-08-26 VITALS — BP 109/73 | HR 99 | Ht 61.0 in | Wt 131.6 lb

## 2023-08-26 VITALS — Ht 61.0 in | Wt 130.0 lb

## 2023-08-26 DIAGNOSIS — M25511 Pain in right shoulder: Secondary | ICD-10-CM | POA: Diagnosis not present

## 2023-08-26 DIAGNOSIS — S46011A Strain of muscle(s) and tendon(s) of the rotator cuff of right shoulder, initial encounter: Secondary | ICD-10-CM | POA: Diagnosis not present

## 2023-08-26 DIAGNOSIS — Z0000 Encounter for general adult medical examination without abnormal findings: Secondary | ICD-10-CM

## 2023-08-26 DIAGNOSIS — M25512 Pain in left shoulder: Secondary | ICD-10-CM | POA: Diagnosis not present

## 2023-08-26 DIAGNOSIS — Z01419 Encounter for gynecological examination (general) (routine) without abnormal findings: Secondary | ICD-10-CM

## 2023-08-26 DIAGNOSIS — E785 Hyperlipidemia, unspecified: Secondary | ICD-10-CM | POA: Diagnosis not present

## 2023-08-26 MED ORDER — DICLOFENAC-MISOPROSTOL 75-0.2 MG PO TBEC: 1.0000 | DELAYED_RELEASE_TABLET | Freq: Two times a day (BID) | ORAL | 3 refills | Status: DC | PRN

## 2023-08-26 MED ORDER — ESTRADIOL 0.1 MG/24HR TD PTTW: 1.0000 | MEDICATED_PATCH | TRANSDERMAL | 3 refills | Status: DC

## 2023-08-26 MED ORDER — SIMVASTATIN 40 MG PO TABS: ORAL_TABLET | ORAL | 3 refills | Status: DC

## 2023-08-26 NOTE — Assessment & Plan Note (Signed)
Patient has what appears to be complete tears of at least 2 tendons of the rotator cuff with significant retraction For pain relief if she desires we can inject the shoulder at any time Currently she will continue using occasional tramadol and stay on diclofenac  I do not think she can rehab this during the acute phase other than keep up shoulder range of motion exercises She will keep working these and do easy exercises in the swimming pool  I need to recheck this in 1 month and will likely give some additional exercises at that time  We ordered x-rays of both shoulders today to see the degree of arthritis that is present At her age I would not be very aggressive about considering surgery and we did discuss that

## 2023-08-26 NOTE — Progress Notes (Addendum)
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 and exercises in the swimming pool She gradually has improved her motion and her pain is quite a bit less She was using Voltaren and supplementing tramadol That we will control the pain Now she is able to do it primarily with just taking 1 Voltaren  However she feels her right arm is not moving as well and still is very weak so she comes for evaluation  Physical exam Pleasant older female in no acute distress Ht 5\' 1"  (1.549 m)   Wt 130 lb (59 kg)   BMI 24.56 kg/m   Examination of the right shoulder shows the following Popeye deformity from remote rupture of biceps Significant weakness on external rotation Weakness on empty can testing Forward flexion to 150 degrees but abduction and elevation only to about 130 degrees Back scratch only to posterior hip on the right Speeds and Yergason were unremarkable today Internal rotation strength was good Abduction strength was good until she has some elevation of the arm  Left shoulder exam Full range of motion Good strength on standard right rotator cuff testing No pain with resisted biceps testing  Ultrasound of the right shoulder There is a complete tear with retraction of the right infraspinatus with the footprint of the muscle and tendon filled with hypoechoic fluid change Teres minor appears intact but does have some hypoechoic change within the muscle Supraspinatus muscle appears retracted beneath the acromion There is some significant arthritic irregularity of the glenoid humeral head AC joint shows advanced arthritis with a geyser sign Subscapularis is intact but again there is irregular spurring  where it crosses the anterior humeral head Biceps tendon is absent with hypoechoic change noted proximally Retracted biceps muscle can be well-visualized and looks intact  Left shoulder we looked at a couple comparison views The biceps tendon shows a hypoechoic target sign with just a few fibers intact superiorly The supraspinatus tendon does look intact but there is some irregularity along the humeral head and the [facility] joint  Impression: Chronic arthritic changes of the before meals and glenohumeral joint on the right with evidence of an acute rotator cuff tear with significant retraction of the supraspinatus and infraspinatus tendons; Remote biceps tendon rupture on the right and a partial tear on the left  Ultrasound and interpretation by Royal Hawthorn B. Zayed Griffie, MD   XR of RT shoulder Severe OA with no soft tissue space suggesting a complete RC tear  XR of LT shoulder The XR shows preserved joint space and no major bony changes

## 2023-08-26 NOTE — Patient Instructions (Signed)
You look great!  Change nothing!

## 2023-08-27 ENCOUNTER — Other Ambulatory Visit: Payer: Self-pay | Admitting: Family Medicine

## 2023-08-27 LAB — COMPREHENSIVE METABOLIC PANEL
ALT: 21 IU/L (ref 0–32)
AST: 23 IU/L (ref 0–40)
Albumin: 4.4 g/dL (ref 3.8–4.8)
Alkaline Phosphatase: 95 IU/L (ref 44–121)
BUN/Creatinine Ratio: 16 (ref 12–28)
BUN: 11 mg/dL (ref 8–27)
Bilirubin Total: 0.3 mg/dL (ref 0.0–1.2)
CO2: 26 mmol/L (ref 20–29)
Calcium: 9.4 mg/dL (ref 8.7–10.3)
Chloride: 102 mmol/L (ref 96–106)
Creatinine, Ser: 0.68 mg/dL (ref 0.57–1.00)
Globulin, Total: 2.1 g/dL (ref 1.5–4.5)
Glucose: 97 mg/dL (ref 70–99)
Potassium: 4.6 mmol/L (ref 3.5–5.2)
Sodium: 140 mmol/L (ref 134–144)
Total Protein: 6.5 g/dL (ref 6.0–8.5)
eGFR: 89 mL/min/{1.73_m2} (ref >59)

## 2023-08-27 LAB — LIPID PANEL
Chol/HDL Ratio: 2.5 ratio (ref 0.0–4.4)
Cholesterol, Total: 174 mg/dL (ref 100–199)
HDL: 71 mg/dL (ref >39)
LDL Chol Calc (NIH): 83 mg/dL (ref 0–99)
Triglycerides: 115 mg/dL (ref 0–149)
VLDL Cholesterol Cal: 20 mg/dL (ref 5–40)

## 2023-08-27 NOTE — Progress Notes (Signed)
    CHIEF COMPLAINT / HPI: Here for well adult checkup.  Needs some refills.  Wants to discuss RSV vaccine Did have a fall the other day, tripped over her flip-flops and has a pretty good scrape on her leg.  Seems to be healing but wants me to look at it.   PERTINENT  PMH / PSH: I have reviewed the patient's medications, allergies, past medical and surgical history, smoking status and updated in the EMR as appropriate.   OBJECTIVE:  BP 109/73   Pulse 99   Ht 5\' 1"  (1.549 m)   Wt 131 lb 9.6 oz (59.7 kg)   SpO2 98%   BMI 24.87 kg/m  Vital signs reviewed. GENERAL: Well-developed, well-nourished, no acute distress. CARDIOVASCULAR: Regular rate and rhythm no murmur gallop or rub LUNGS: Clear to auscultation bilaterally, no rales or wheeze. ABDOMEN: Soft positive bowel sounds NEURO: No gross focal neurological deficits. MSK: Movement of extremity x 4. PSYCH: AxOx4. Good eye contact.. No psychomotor retardation or agitation. Appropriate speech fluency and content. Asks and answers questions appropriately. Mood is congruent. SKIN healing scrape RLE , no sign of infection.  ASSESSMENT / PLAN:   Well woman exam Doing extremely well. Active. Continue current meds and refills given Labs today. UTD on health maintenance. We discussed RSV vaccine.   Denny Levy MD

## 2023-08-27 NOTE — Assessment & Plan Note (Signed)
Doing extremely well. Active. Continue current meds and refills given Labs today. UTD on health maintenance. We discussed RSV vaccine.

## 2023-08-27 NOTE — Assessment & Plan Note (Signed)
>>  ASSESSMENT AND PLAN FOR WELL WOMAN EXAM WRITTEN ON 08/27/2023  7:54 PM BY Davinity Fanara L, MD  Doing extremely well. Active. Continue current meds and refills given Labs today. UTD on health maintenance. We discussed RSV vaccine.

## 2023-08-28 ENCOUNTER — Other Ambulatory Visit: Payer: Medicare PPO | Admitting: Family Medicine

## 2023-08-29 ENCOUNTER — Encounter: Payer: Self-pay | Admitting: Family Medicine

## 2023-09-16 ENCOUNTER — Other Ambulatory Visit: Payer: Self-pay

## 2023-09-16 ENCOUNTER — Encounter: Payer: Self-pay | Admitting: Sports Medicine

## 2023-09-16 ENCOUNTER — Ambulatory Visit (INDEPENDENT_AMBULATORY_CARE_PROVIDER_SITE_OTHER): Payer: Medicare PPO | Admitting: Sports Medicine

## 2023-09-16 VITALS — BP 120/78 | Ht 61.0 in | Wt 130.0 lb

## 2023-09-16 DIAGNOSIS — S46011D Strain of muscle(s) and tendon(s) of the rotator cuff of right shoulder, subsequent encounter: Secondary | ICD-10-CM

## 2023-09-16 DIAGNOSIS — M25511 Pain in right shoulder: Secondary | ICD-10-CM

## 2023-09-16 NOTE — Progress Notes (Signed)
PCP: Nestor Ramp, MD  Subjective:   HPI: Patient is a 82 y.o. female here for follow-up after fall causing some right arm pain.  Patient was noted to have a significant complete tear of the right infraspinatus with retraction.  Supraspinatus also was appearing to retract behind the acromion.  Patient states that she has been doing the range of motion exercises in her pool over the past few weeks and has noted significant improvement with both her range of motion as well as with some of her strength.  Patient states that the pain has reduced significantly and she is down to taking 1 tramadol and 1 NSAID a day and is able to sleep.  Patient is continue to do these exercises though she does note that with the colder weather coming up she may not be able to use the pool as much anymore.  History reviewed. No pertinent past medical history.  Current Outpatient Medications on File Prior to Visit  Medication Sig Dispense Refill   citalopram (CELEXA) 20 MG tablet TAKE 1 TABLET(20 MG) BY MOUTH DAILY 90 tablet 3   Diclofenac-miSOPROStol 75-0.2 MG TBEC TAKE 1 TABLET BY MOUTH TWICE DAILY AS NEEDED FOR PAIN 180 tablet 3   estradiol (ESTRADERM) 0.1 MG/24HR patch Place 1 patch (0.1 mg total) onto the skin 2 (two) times a week. 24 patch 3   fluorouracil (EFUDEX) 5 % cream APP TOPICALLY AA QD MON THROUGH FRI FOR 6 WKS     gabapentin (NEURONTIN) 300 MG capsule Taper up to 3 capsules at bedtime and patient has written taper 270 capsule 3   lidocaine (XYLOCAINE) 5 % ointment Apply 1 application topically 3 (three) times daily as needed. 35.44 g 0   simvastatin (ZOCOR) 40 MG tablet TAKE 1 TABLET(40 MG) BY MOUTH AT BEDTIME 90 tablet 3   traMADol (ULTRAM) 50 MG tablet Take one or two tablets three times a day as needed for pain max of 6 tabs in 24 hours 180 tablet 5   No current facility-administered medications on file prior to visit.    Past Surgical History:  Procedure Laterality Date   ABDOMINAL HYSTERECTOMY      TOTAL HIP ARTHROPLASTY     Right   WRIST SURGERY Right     Allergies  Allergen Reactions   Keflex [Cephalexin] Nausea And Vomiting    BP 120/78   Ht 5\' 1"  (1.549 m)   Wt 130 lb (59 kg)   BMI 24.56 kg/m       No data to display              No data to display              Objective:  Physical Exam:  Gen: NAD, comfortable in exam room  Shoulder, Bilateral:  No  skin changes, erythema, or ecchymosis noted. There is a noted pop eye deformity of the right bicep. Mild tenderness over long head of biceps (bicipital groove) of the right arm. No TTP at [facility] joint. Near full active and passive range of motion (180 flex Elisabeth Most /150Abd /90ER /70IR) of the left shoulder.  There is decreased range of motion of the right shoulder with a reduction between 0 and 70 degrees, external rotation to about 40 degrees, and full range of motion with internal rotation.  Strength 4/5 throughout the left shoulder, and strength decreased significantly 2-3/5.   Special Tests:   - Empty can: POS   - Int/Ext Rotation test: POS   Rush Barer  Lift-Off Test: NEG   - Crossarm Adduction test: POS   - Hawkins: NEG   - Neer test: POS   - O'brien's test: POS   - Yergason's: NEG   - Speeds test: NEG  U/S Findings:  Supraspinatus Tendon: Tear of the supraspinatus with retraction beneath the acromion.  The tendon has significant hypoechoic fluid changes consistent with previous ultrasound  Infraspinatus Tendon: Complete tear with retraction of the right infraspinatus with the footprint of the muscle and tendon filled with hypoechoic fluid change   Teres Minor Tendon: The teres minor tendon is intact and has normal appearance though there is also some hypoechoic changes noted within the muscle  Subscapularis Tendon: Subscapularis tendon is intact and shows normal structure, mild hypoechoic changes noted of the subscapularis tendon.  Biceps tendon with hypoechoic changes noted proximally.   Biceps tendon itself appears to be absent and retracted biceps muscle.  Distal biceps muscle appears intact.  AC joint shows geyser sign consistent with fluid accumulation.  Hypoechoic changes noted within the [facility] joint   Impression: Chronic arthritic changes of the glenohumeral joint with continued evidence of complete tear of the infraspinatus and significant tear of the supraspinatus.  Biceps tendon rupture of the right biceps.  Consistent with previous ultrasound done approximately 3 weeks ago.   Ultrasound and interpretation by Brenton Grills MD and Sibyl Parr. Fields, MD  Assessment & Plan:  1. 1. Traumatic rotator cuff tear, right, subsequent encounter -  Patients ROM has improved from previous appointment.  Patient's ultrasound findings are consistent with previously and patient is likely dealing with significant tears throughout the supraspinatus and infraspinatus.  Patient at this time would not like to discuss any surgical interventions.  Would continue with range of motion exercises as well as pool therapy.  Patient was advised to follow-up again in 4 weeks and at that time we will reevaluate and examine her shoulder.  Patient at that time if continues to have good range of motion can start to begin strengthening exercises.  Patient is understanding and agreeable plan. - [place] COMPLETE JOINT SPACE STRUCTURES UP RIGHT; Future    Brenton Grills MD, PGY-4  Sports Medicine Fellow [facility]  I observed and examined the patient with the [facility] resident and agree with assessment and plan.  Note reviewed and modified by me. Vedia Coffer

## 2023-10-14 ENCOUNTER — Ambulatory Visit (INDEPENDENT_AMBULATORY_CARE_PROVIDER_SITE_OTHER): Payer: Medicare PPO | Admitting: Sports Medicine

## 2023-10-14 ENCOUNTER — Other Ambulatory Visit: Payer: Self-pay

## 2023-10-14 VITALS — BP 114/78 | Ht 61.0 in | Wt 130.0 lb

## 2023-10-14 DIAGNOSIS — S46011D Strain of muscle(s) and tendon(s) of the rotator cuff of right shoulder, subsequent encounter: Secondary | ICD-10-CM

## 2023-10-14 DIAGNOSIS — M159 Polyosteoarthritis, unspecified: Secondary | ICD-10-CM

## 2023-10-14 NOTE — Progress Notes (Signed)
PCP: Nestor Ramp, MD  Chief Complaint: Right shoulder pain Subjective:   HPI: Patient is a 82 y.o. female here for follow-up of her right shoulder pain.  Patient has been doing the exercises that are prescribed to her and states that she has had increased in her range of motion as well as decrease in her pain but still notes some lack of motion especially when lifting above 90 degrees.  Patient has been doing the biceps exercises and notes increased strength with her biceps.  Patient also notes that she has been able to sleep better at night and has not required as much pain medication.    No past medical history on file.  Current Outpatient Medications on File Prior to Visit  Medication Sig Dispense Refill   citalopram (CELEXA) 20 MG tablet TAKE 1 TABLET(20 MG) BY MOUTH DAILY 90 tablet 3   Diclofenac-miSOPROStol 75-0.2 MG TBEC TAKE 1 TABLET BY MOUTH TWICE DAILY AS NEEDED FOR PAIN 180 tablet 3   estradiol (ESTRADERM) 0.1 MG/24HR patch Place 1 patch (0.1 mg total) onto the skin 2 (two) times a week. 24 patch 3   fluorouracil (EFUDEX) 5 % cream APP TOPICALLY AA QD MON THROUGH FRI FOR 6 WKS     gabapentin (NEURONTIN) 300 MG capsule Taper up to 3 capsules at bedtime and patient has written taper 270 capsule 3   lidocaine (XYLOCAINE) 5 % ointment Apply 1 application topically 3 (three) times daily as needed. 35.44 g 0   simvastatin (ZOCOR) 40 MG tablet TAKE 1 TABLET(40 MG) BY MOUTH AT BEDTIME 90 tablet 3   traMADol (ULTRAM) 50 MG tablet Take one or two tablets three times a day as needed for pain max of 6 tabs in 24 hours 180 tablet 5   No current facility-administered medications on file prior to visit.    Past Surgical History:  Procedure Laterality Date   ABDOMINAL HYSTERECTOMY     TOTAL HIP ARTHROPLASTY     Right   WRIST SURGERY Right     Allergies  Allergen Reactions   Keflex [Cephalexin] Nausea And Vomiting    BP 114/78   Ht 5\' 1"  (1.549 m)   Wt 130 lb (59 kg)   BMI 24.56  kg/m       No data to display              No data to display              Objective:  Physical Exam:  Gen: NAD, comfortable in exam room  Shoulder, Right: No  skin changes, erythema, or ecchymosis noted. No evidence of bony deformity, asymmetry, or muscle atrophy; Mild tenderness over long head of biceps (bicipital groove). Mild TTP at [facility] joint. Decreased range of motion with external rotation to about 30 degrees, internal rotation is appropriate, decreased flexion of the shoulder to about 80 degrees.  Decreased abduction of the shoulder to about 100 degrees. Thumb to T12 without significant tenderness. Strength 3/5 against resistance with abduction and external rotation, internal rotation strength 4 out of 5. No abnormal scapular function observed. Sensation to light touch intact. Peripheral pulses intact. DTR's .   Special Tests:   - Painful Arc present at 80 degrees   - Empty can: POS   - Int/Ext Rotation test: NEG/POS   - Crossarm Adduction test: POS   - Hawkins: POS   - Neer test: POS   - O'brien's test: POS   - Yergason's: NEG   - Speeds  test: NEG    U/S shoulder: Right Supraspinatus Tendon: Tear of the supraspinatus with retraction beneath the acromion.  The tendon has significant hypoechoic fluid changes consistent with previous ultrasound   Infraspinatus Tendon: Complete tear with retraction of the right infraspinatus with the footprint of the muscle and tendon filled with hypoechoic fluid change. Findings are consistent with previous ultrasound  Teres Minor Tendon: The teres minor tendon is intact and has normal appearance though there is also some hypoechoic changes noted within the muscle   Subscapularis Tendon: Subscapularis tendon is intact and shows normal structure, mild hypoechoic changes noted of the subscapularis tendon.  Biceps tendon s noted to have a hypoechoic changes likely consistent with fluid.  There does not appear to be a complete tear of  the biceps/ possible partial tear --most fluid/hypoechoic changes appear to be coming likely from the supraspinatus.  This is in contrast to previous ultrasounds which appear to have an absent and retracted biceps tendon.  Impression: Patient with known complete tear of the infraspinatus as well as complete retraction and tear of the supraspinatus.  The biceps tendon does not appear to have a complete tear and has good tendon attachment as well as musculature.  There is some hypoechoic changes of the bicep tendon though this is likely related to fluid from the supraspinatus.  Findings appear to be similar in the rotator cuff as previous ultrasounds.  Ultrasound and interpretation by Dr. Benjiman Core and Sibyl Parr. Fields, MD  Assessment & Plan:  1. 1. Traumatic rotator cuff tear, right, subsequent encounter - Discussed with patient the goal regarding her rotator cuff rehab.  Given the patient has a significant tear of both her supraspinatus infraspinatus tendon, patient would benefit from continuing doing strengthening exercises as she would likely need to have accessory muscles participate in any sort of abduction activity.  Patient has had good pain relief with her current regiment and would not like to add anything else in at this time. Will have patient continue doing her exercises and rehab and follow-up in 2 months.  Will rescan at that time to see if there has been any change. - [place] COMPLETE JOINT SPACE STRUCTURES UP RIGHT; Future    Brenton Grills MD, PGY-4  Sports Medicine Fellow [facility]  I observed and examined the patient with the [facility] resident and agree with assessment and plan.  Note reviewed and modified by me. Sterling Big, MD

## 2023-10-23 ENCOUNTER — Telehealth: Payer: Self-pay

## 2023-10-23 DIAGNOSIS — S91331A Puncture wound without foreign body, right foot, initial encounter: Secondary | ICD-10-CM | POA: Diagnosis not present

## 2023-10-23 DIAGNOSIS — Z23 Encounter for immunization: Secondary | ICD-10-CM | POA: Diagnosis not present

## 2023-10-23 NOTE — Telephone Encounter (Signed)
Spoke w pt via text. She needs updated Td and can getthat at an urgent care closer to her hoime if she wishes. Red flags discussed.

## 2023-10-23 NOTE — Telephone Encounter (Signed)
Patient calls nurse line regarding stepping on a rusty nail. She states that nail went through her shoe and into her foot. She reports small amount of bleeding that has resolved.   Last Tdap was 08/10/2014.  Patient is going to clean the area and apply bandage. Advised of return precautions.   Will forward to PCP for any further recommendations.   Veronda Prude, RN

## 2023-11-13 ENCOUNTER — Other Ambulatory Visit: Payer: Self-pay | Admitting: Family Medicine

## 2023-11-13 MED ORDER — NIRMATRELVIR&RITONAVIR 300/100 20 X 150 MG & 10 X 100MG PO TBPK: 3.0000 | ORAL_TABLET | Freq: Two times a day (BID) | ORAL | 0 refills | Status: AC

## 2023-11-13 NOTE — Progress Notes (Signed)
Exposed to COVID

## 2023-11-26 ENCOUNTER — Ambulatory Visit: Payer: Medicare PPO | Admitting: Sports Medicine

## 2023-11-26 VITALS — BP 124/86 | Ht 61.0 in | Wt 130.0 lb

## 2023-11-26 DIAGNOSIS — S46011D Strain of muscle(s) and tendon(s) of the rotator cuff of right shoulder, subsequent encounter: Secondary | ICD-10-CM

## 2023-11-26 DIAGNOSIS — M19011 Primary osteoarthritis, right shoulder: Secondary | ICD-10-CM | POA: Diagnosis not present

## 2023-11-26 NOTE — Progress Notes (Signed)
   PCP: Nestor Ramp, MD  SUBJECTIVE:   HPI:  Patient is a 82 y.o. female here for follow-up of her right shoulder. She has known full thickness tearing of the supra and infraspinatus on the right. She notes over the past 6 weeks the pain with ROM and sleeping are greatly improved. Her ROM is also improved, but strength is still limited. She has been avidly rehabbing this on her own at home, though is interested in formal PT referral for additional ROM if possible.  ROS:     See HPI  PERTINENT  PMH / PSH FH / / SH:  Past Medical, Surgical, Social, and Family History Reviewed & Updated in the EMR.  Pertinent findings include:  Osteoarthritis of her right GH joint, right hip s/p THA, left knee s/p TKA, bilateral hands  Allergies  Allergen Reactions   Keflex [Cephalexin] Nausea And Vomiting    OBJECTIVE:  BP 124/86   Ht 5\' 1"  (1.549 m)   Wt 130 lb (59 kg)   BMI 24.56 kg/m   PHYSICAL EXAM:  GEN: Alert and Oriented, NAD, comfortable in exam room RESP: Unlabored respirations, symmetric chest rise PSY: normal mood, congruent affect   Right Shoulder MSK EXAM: No swelling, ecchymoses. Mild TTP at [facility] joint. ROM limited, but improved from October visit. Flexion 130d, abduction 90d, ER 30d, IR to L4. Significant crepitus palpable with ROM. Strength greatly limited to 3/5 with empty can, 3+/5 with full can, 3+/5 ER, 5/5 IR. Pain with can and ER resistance. + neers, hawkins, obriens NV intact distally. Assessment & Plan Traumatic rotator cuff tear, right, subsequent encounter She continues to make gradual improvement with home rehabilitation in both ROM and pain, though with persistent weakness and she would like further improvement in her ROM. I reviewed her last two shoulder U/S as well as her XR from last month which shows a high riding humeral head with moderate to severe degenerative changes. We discussed that even with formal PT she will likely have persistent deficiencies in ROM and  strength, but may gain more functionality of the shoulder. Especially considering her GH arthritis, she would be a candidate for reverse shoulder replacement, however she would like to wait on this which is certainly appropriate. Referral placed for formal PT with [facility] in [place], [place] for both the RTC and GH arthritis. Will f/u in 6 weeks and repeat her U/S at that time and consider referral to Dr Everardo Pacific at [facility] were she to be interested in replacement.  Arthritis of right glenohumeral joint Discussed above.  Patient's questions were answered and she is in agreement with this plan.  Glean Salen, MD PGY-4, Sports Medicine Fellow [facility]  I observed and examined the patient with the [facility] resident and agree with assessment and plan.  Note reviewed and modified by me.  In discussion with patient I agree with continued conservative care as her shoulder motion and function is greatly improved compared to her first visit.  If she has too much limitation of ADLs post PT, we will revisit question of surgical referral. Sterling Big, MD

## 2023-11-27 ENCOUNTER — Other Ambulatory Visit: Payer: Self-pay

## 2023-11-27 DIAGNOSIS — M19011 Primary osteoarthritis, right shoulder: Secondary | ICD-10-CM

## 2023-11-27 DIAGNOSIS — S46011D Strain of muscle(s) and tendon(s) of the rotator cuff of right shoulder, subsequent encounter: Secondary | ICD-10-CM

## 2023-11-27 NOTE — Progress Notes (Signed)
[facility] PT office called and said they are scheduling out until mid January. Their OT can see pt's sooner and can work on the shoulder. Recommended we send new referral for OT so they can get pt in.  New referral sent.

## 2023-11-30 NOTE — Addendum Note (Signed)
Addended by: Rutha Bouchard E on: 11/30/2023 04:58 PM   Modules accepted: Orders

## 2023-12-03 ENCOUNTER — Ambulatory Visit: Payer: Medicare PPO | Admitting: Sports Medicine

## 2023-12-09 ENCOUNTER — Ambulatory Visit: Payer: Medicare PPO | Admitting: Sports Medicine

## 2023-12-14 DIAGNOSIS — C44729 Squamous cell carcinoma of skin of left lower limb, including hip: Secondary | ICD-10-CM | POA: Diagnosis not present

## 2023-12-14 DIAGNOSIS — L821 Other seborrheic keratosis: Secondary | ICD-10-CM | POA: Diagnosis not present

## 2023-12-17 ENCOUNTER — Other Ambulatory Visit: Payer: Self-pay | Admitting: Family Medicine

## 2023-12-17 MED ORDER — HYDROCODONE BIT-HOMATROP MBR 5-1.5 MG/5ML PO SOLN: 5.0000 mL | Freq: Four times a day (QID) | ORAL | 0 refills | Status: DC | PRN

## 2023-12-17 NOTE — Progress Notes (Signed)
Several people on family have influenza. She has cough/ Wants refill on cough syrup she received last year so I have refilled it.

## 2023-12-21 DIAGNOSIS — M19011 Primary osteoarthritis, right shoulder: Secondary | ICD-10-CM | POA: Diagnosis not present

## 2023-12-21 DIAGNOSIS — Z79899 Other long term (current) drug therapy: Secondary | ICD-10-CM | POA: Diagnosis not present

## 2023-12-21 DIAGNOSIS — M6281 Muscle weakness (generalized): Secondary | ICD-10-CM | POA: Diagnosis not present

## 2023-12-21 DIAGNOSIS — M25511 Pain in right shoulder: Secondary | ICD-10-CM | POA: Diagnosis not present

## 2023-12-21 DIAGNOSIS — Z9181 History of falling: Secondary | ICD-10-CM | POA: Diagnosis not present

## 2023-12-21 DIAGNOSIS — M25611 Stiffness of right shoulder, not elsewhere classified: Secondary | ICD-10-CM | POA: Diagnosis not present

## 2023-12-21 DIAGNOSIS — G8929 Other chronic pain: Secondary | ICD-10-CM | POA: Diagnosis not present

## 2023-12-21 DIAGNOSIS — S46011D Strain of muscle(s) and tendon(s) of the rotator cuff of right shoulder, subsequent encounter: Secondary | ICD-10-CM | POA: Diagnosis not present

## 2023-12-25 DIAGNOSIS — M19011 Primary osteoarthritis, right shoulder: Secondary | ICD-10-CM | POA: Diagnosis not present

## 2023-12-25 DIAGNOSIS — M6281 Muscle weakness (generalized): Secondary | ICD-10-CM | POA: Diagnosis not present

## 2023-12-25 DIAGNOSIS — M25511 Pain in right shoulder: Secondary | ICD-10-CM | POA: Diagnosis not present

## 2023-12-25 DIAGNOSIS — Z9181 History of falling: Secondary | ICD-10-CM | POA: Diagnosis not present

## 2023-12-25 DIAGNOSIS — Z79899 Other long term (current) drug therapy: Secondary | ICD-10-CM | POA: Diagnosis not present

## 2023-12-25 DIAGNOSIS — G8929 Other chronic pain: Secondary | ICD-10-CM | POA: Diagnosis not present

## 2023-12-25 DIAGNOSIS — S46011D Strain of muscle(s) and tendon(s) of the rotator cuff of right shoulder, subsequent encounter: Secondary | ICD-10-CM | POA: Diagnosis not present

## 2023-12-25 DIAGNOSIS — M25611 Stiffness of right shoulder, not elsewhere classified: Secondary | ICD-10-CM | POA: Diagnosis not present

## 2023-12-29 DIAGNOSIS — M6281 Muscle weakness (generalized): Secondary | ICD-10-CM | POA: Diagnosis not present

## 2023-12-29 DIAGNOSIS — G8929 Other chronic pain: Secondary | ICD-10-CM | POA: Diagnosis not present

## 2023-12-29 DIAGNOSIS — M25611 Stiffness of right shoulder, not elsewhere classified: Secondary | ICD-10-CM | POA: Diagnosis not present

## 2023-12-29 DIAGNOSIS — Z9181 History of falling: Secondary | ICD-10-CM | POA: Diagnosis not present

## 2023-12-29 DIAGNOSIS — S46011D Strain of muscle(s) and tendon(s) of the rotator cuff of right shoulder, subsequent encounter: Secondary | ICD-10-CM | POA: Diagnosis not present

## 2023-12-29 DIAGNOSIS — M25511 Pain in right shoulder: Secondary | ICD-10-CM | POA: Diagnosis not present

## 2023-12-29 DIAGNOSIS — M19011 Primary osteoarthritis, right shoulder: Secondary | ICD-10-CM | POA: Diagnosis not present

## 2023-12-29 DIAGNOSIS — Z79899 Other long term (current) drug therapy: Secondary | ICD-10-CM | POA: Diagnosis not present

## 2024-01-05 DIAGNOSIS — G8929 Other chronic pain: Secondary | ICD-10-CM | POA: Diagnosis not present

## 2024-01-05 DIAGNOSIS — S46011D Strain of muscle(s) and tendon(s) of the rotator cuff of right shoulder, subsequent encounter: Secondary | ICD-10-CM | POA: Diagnosis not present

## 2024-01-05 DIAGNOSIS — Z9181 History of falling: Secondary | ICD-10-CM | POA: Diagnosis not present

## 2024-01-05 DIAGNOSIS — M25611 Stiffness of right shoulder, not elsewhere classified: Secondary | ICD-10-CM | POA: Diagnosis not present

## 2024-01-05 DIAGNOSIS — Z79899 Other long term (current) drug therapy: Secondary | ICD-10-CM | POA: Diagnosis not present

## 2024-01-05 DIAGNOSIS — M25511 Pain in right shoulder: Secondary | ICD-10-CM | POA: Diagnosis not present

## 2024-01-05 DIAGNOSIS — M19011 Primary osteoarthritis, right shoulder: Secondary | ICD-10-CM | POA: Diagnosis not present

## 2024-01-05 DIAGNOSIS — M6281 Muscle weakness (generalized): Secondary | ICD-10-CM | POA: Diagnosis not present

## 2024-01-12 DIAGNOSIS — M25611 Stiffness of right shoulder, not elsewhere classified: Secondary | ICD-10-CM | POA: Diagnosis not present

## 2024-01-12 DIAGNOSIS — M25511 Pain in right shoulder: Secondary | ICD-10-CM | POA: Diagnosis not present

## 2024-01-12 DIAGNOSIS — M19011 Primary osteoarthritis, right shoulder: Secondary | ICD-10-CM | POA: Diagnosis not present

## 2024-01-12 DIAGNOSIS — M6281 Muscle weakness (generalized): Secondary | ICD-10-CM | POA: Diagnosis not present

## 2024-01-12 DIAGNOSIS — S46011D Strain of muscle(s) and tendon(s) of the rotator cuff of right shoulder, subsequent encounter: Secondary | ICD-10-CM | POA: Diagnosis not present

## 2024-01-12 DIAGNOSIS — G8929 Other chronic pain: Secondary | ICD-10-CM | POA: Diagnosis not present

## 2024-01-12 DIAGNOSIS — Z79899 Other long term (current) drug therapy: Secondary | ICD-10-CM | POA: Diagnosis not present

## 2024-01-12 DIAGNOSIS — Z9181 History of falling: Secondary | ICD-10-CM | POA: Diagnosis not present

## 2024-01-19 DIAGNOSIS — M25511 Pain in right shoulder: Secondary | ICD-10-CM | POA: Diagnosis not present

## 2024-01-19 DIAGNOSIS — M25611 Stiffness of right shoulder, not elsewhere classified: Secondary | ICD-10-CM | POA: Diagnosis not present

## 2024-01-19 DIAGNOSIS — M6281 Muscle weakness (generalized): Secondary | ICD-10-CM | POA: Diagnosis not present

## 2024-01-19 DIAGNOSIS — Z9181 History of falling: Secondary | ICD-10-CM | POA: Diagnosis not present

## 2024-01-19 DIAGNOSIS — S46011D Strain of muscle(s) and tendon(s) of the rotator cuff of right shoulder, subsequent encounter: Secondary | ICD-10-CM | POA: Diagnosis not present

## 2024-01-19 DIAGNOSIS — G8929 Other chronic pain: Secondary | ICD-10-CM | POA: Diagnosis not present

## 2024-01-19 DIAGNOSIS — Z79899 Other long term (current) drug therapy: Secondary | ICD-10-CM | POA: Diagnosis not present

## 2024-01-19 DIAGNOSIS — M19011 Primary osteoarthritis, right shoulder: Secondary | ICD-10-CM | POA: Diagnosis not present

## 2024-01-20 DIAGNOSIS — L570 Actinic keratosis: Secondary | ICD-10-CM | POA: Diagnosis not present

## 2024-01-20 DIAGNOSIS — Z859 Personal history of malignant neoplasm, unspecified: Secondary | ICD-10-CM | POA: Diagnosis not present

## 2024-01-20 DIAGNOSIS — L821 Other seborrheic keratosis: Secondary | ICD-10-CM | POA: Diagnosis not present

## 2024-01-20 DIAGNOSIS — Z129 Encounter for screening for malignant neoplasm, site unspecified: Secondary | ICD-10-CM | POA: Diagnosis not present

## 2024-01-21 ENCOUNTER — Ambulatory Visit: Payer: Medicare PPO

## 2024-01-21 VITALS — Ht 61.0 in | Wt 129.0 lb

## 2024-01-21 DIAGNOSIS — G8929 Other chronic pain: Secondary | ICD-10-CM | POA: Diagnosis not present

## 2024-01-21 DIAGNOSIS — M25611 Stiffness of right shoulder, not elsewhere classified: Secondary | ICD-10-CM | POA: Diagnosis not present

## 2024-01-21 DIAGNOSIS — Z0000 Encounter for general adult medical examination without abnormal findings: Secondary | ICD-10-CM

## 2024-01-21 DIAGNOSIS — S46011D Strain of muscle(s) and tendon(s) of the rotator cuff of right shoulder, subsequent encounter: Secondary | ICD-10-CM | POA: Diagnosis not present

## 2024-01-21 DIAGNOSIS — M19011 Primary osteoarthritis, right shoulder: Secondary | ICD-10-CM | POA: Diagnosis not present

## 2024-01-21 DIAGNOSIS — Z9181 History of falling: Secondary | ICD-10-CM | POA: Diagnosis not present

## 2024-01-21 DIAGNOSIS — Z79899 Other long term (current) drug therapy: Secondary | ICD-10-CM | POA: Diagnosis not present

## 2024-01-21 DIAGNOSIS — M6281 Muscle weakness (generalized): Secondary | ICD-10-CM | POA: Diagnosis not present

## 2024-01-21 DIAGNOSIS — M25511 Pain in right shoulder: Secondary | ICD-10-CM | POA: Diagnosis not present

## 2024-01-21 NOTE — Progress Notes (Signed)
Subjective:   Margaret Barton is a 83 y.o. female who presents for Medicare Annual (Subsequent) preventive examination.  Visit Complete: Virtual I connected with  Margaret Barton on 01/21/24 by a audio enabled telemedicine application and verified that I am speaking with the correct person using two identifiers.  Patient Location: Home  Provider Location: Office/Clinic  I discussed the limitations of evaluation and management by telemedicine. The patient expressed understanding and agreed to proceed.  Vital Signs: Because this visit was a virtual/telehealth visit, some criteria may be missing or patient reported. Any vitals not documented were not able to be obtained and vitals that have been documented are patient reported.  This patient declined Interactive audio and Acupuncturist. Therefore the visit was completed with audio only.  Cardiac Risk Factors include: advanced age (>57men, >63 women);dyslipidemia;family history of premature cardiovascular disease (works in Primary school teacher; swimming everyday in the Summer)     Objective:    Today's Vitals   01/21/24 1502  Weight: 129 lb (58.5 kg)  Height: 5\' 1"  (1.549 m)  PainSc: 0-No pain   Body mass index is 24.37 kg/m.     01/21/2024    3:03 PM 12/18/2022    8:09 AM 08/20/2022    9:20 AM 08/14/2021    8:32 AM 04/03/2021    8:30 AM 12/05/2020    9:51 AM 08/08/2020    8:53 AM  Advanced Directives  Does Patient Have a Medical Advance Directive? Yes Yes No No Yes No No  Type of Estate agent of [place];Living will Healthcare Power of [employer] Power of Attorney    Copy of Healthcare Power of Attorney in Chart? No - copy requested No - copy requested   No - copy requested    Would patient like information on creating a medical advance directive?    No - Patient declined  No - Patient declined No - Patient declined    Current Medications (verified) Outpatient Encounter Medications as of  01/21/2024  Medication Sig   citalopram (CELEXA) 20 MG tablet TAKE 1 TABLET(20 MG) BY MOUTH DAILY   Diclofenac-miSOPROStol 75-0.2 MG TBEC TAKE 1 TABLET BY MOUTH TWICE DAILY AS NEEDED FOR PAIN   estradiol (ESTRADERM) 0.1 MG/24HR patch Place 1 patch (0.1 mg total) onto the skin 2 (two) times a week.   fluorouracil (EFUDEX) 5 % cream APP TOPICALLY AA QD MON THROUGH FRI FOR 6 WKS   gabapentin (NEURONTIN) 300 MG capsule Taper up to 3 capsules at bedtime and patient has written taper   HYDROcodone bit-homatropine (HYCODAN) 5-1.5 MG/5ML syrup Take 5 mLs by mouth every 6 (six) hours as needed for cough.   lidocaine (XYLOCAINE) 5 % ointment Apply 1 application topically 3 (three) times daily as needed.   simvastatin (ZOCOR) 40 MG tablet TAKE 1 TABLET(40 MG) BY MOUTH AT BEDTIME   traMADol (ULTRAM) 50 MG tablet Take one or two tablets three times a day as needed for pain max of 6 tabs in 24 hours   No facility-administered encounter medications on file as of 01/21/2024.    Allergies (verified) Keflex [cephalexin]   History: History reviewed. No pertinent past medical history. 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 Denny Levy   End of Life Plan:    Who lives with you: Lives with husband on 20 acres of property   Any pets: 1 german shepard, 3 cats   Diet: Patient has a varied diet of protein, starch, and fruits and vegetables   Exercise: Patient exercises 4-5 times a week.   Seatbelts: Patient reports wearing seat belt when in vehicle.   Wynelle Link Exposure/Protection: Patient reports wear sun screen daily.   Hobbies: Gardening, sports, teaching, cooking            Social Drivers of Health   Financial Resource Strain: Low Risk  (01/21/2024)   Overall Financial Resource Strain (CARDIA)    Difficulty of Paying Living Expenses: Not hard at all  Food Insecurity: No Food Insecurity (01/21/2024)   Hunger Vital Sign    Worried About Running Out of Food in the Last Year: Never true    Ran Out of Food in the Last Year: Never true  Transportation Needs: No Transportation Needs (01/21/2024)   PRAPARE - Administrator, Civil Service (Medical): No    Lack of Transportation (Non-Medical): No  Physical Activity: Sufficiently Active (01/21/2024)   Exercise Vital Sign    Days of Exercise per Week: 7 days    Minutes of Exercise per Session: 30 min  Stress: No Stress Concern Present (01/21/2024)   [employer] of Occupational Health - Occupational Stress Questionnaire    Feeling of Stress : Not at all  Social Connections: Socially Integrated (01/21/2024)   Social Connection and Isolation Panel [NHANES]    Frequency of Communication with Friends and Family: More than three times a week    Frequency of Social Gatherings with Friends and Family: More than three times a week    Attends Religious Services: More than 4 times per year    Active Member of [employer] or Organizations: Yes    Attends Banker Meetings: 1 to 4 times per year    Marital Status: Married    Tobacco  Counseling Counseling given: Not Answered   Clinical Intake:  Pre-visit preparation completed: Yes  Pain : No/denies pain Pain Score: 0-No pain     BMI - recorded: 24.37 Nutritional Status: BMI of 19-24  Normal Nutritional Risks: None Diabetes: No  How often do you need to have someone help you when you read instructions, pamphlets, or other written materials from your doctor or pharmacy?: 1 - Never What is the last grade level you completed in school?: N/A  Interpreter Needed?: No  Information entered by :: Zaahir Pickney N. Hortensia Duffin, LPN.   Activities of Daily Living    01/21/2024  3:06 PM  In your present state of health, do you have any difficulty performing the following activities:  Hearing? 0  Vision? 0  Difficulty concentrating or making decisions? 0  Walking or climbing stairs? 0  Dressing or bathing? 0  Doing errands, shopping? 0  Preparing Food and eating ? N  Using the Toilet? N  In the past six months, have you accidently leaked urine? N  Do you have problems with loss of bowel control? N  Managing your Medications? N  Managing your Finances? N  Housekeeping or managing your Housekeeping? N    Patient Care Team: Nestor Ramp, MD as PCP - General Silvestre Gunner Rosalyn Gess., MD (Obstetrics and Gynecology) Dr. Caryn Section (Dentistry) Dr. Glennon Hamilton (Dermatology) Rhona Leavens. Vanetta Mulders, MD. as Referring Physician (Ophthalmology)  Indicate any recent Medical Services you may have received from other than [facility] providers in the past year (date may be approximate).     Assessment:   This is a routine wellness examination for [facility].  Hearing/Vision screen Hearing Screening - Comments:: Patient denied any hearing difficulty.   No hearing aids.  Vision Screening - Comments:: Patient does wears readers.  Eye exam done by: Dr. Dorann Lodge   Goals Addressed             This Visit's Progress    My goal for 2025 is to stay active and independent.        Depression  Screen    01/21/2024    3:07 PM 08/26/2023    9:22 AM 12/18/2022    8:06 AM 08/20/2022    9:20 AM 08/14/2021    8:44 AM 04/03/2021    8:32 AM 12/05/2020    9:51 AM  PHQ 2/9 Scores  PHQ - 2 Score 0 2 0 0 1 1 1   PHQ- 9 Score 0 5  3 6 3 5     Fall Risk    01/21/2024    3:04 PM 08/26/2023    8:23 AM 12/18/2022    8:09 AM 04/03/2021    8:30 AM 12/05/2020    9:50 AM  Fall Risk   Falls in the past year? 1 1 1  0 0  Number falls in past yr: 0 0 0    Injury with Fall? 0 1 1    Comment tripped over flipflops going up the stairs      Risk for fall due to : No Fall Risks      Follow up Falls evaluation completed;Education provided        MEDICARE RISK AT HOME: Medicare Risk at Home Any stairs in or around the home?: Yes If so, are there any without handrails?: No Home free of loose throw rugs in walkways, pet beds, electrical cords, etc?: Yes Adequate lighting in your home to reduce risk of falls?: Yes Life alert?: No Use of a cane, walker or w/c?: No Grab bars in the bathroom?: No Shower chair or bench in shower?: No Elevated toilet seat or a handicapped toilet?: Yes (one standard, one higher raised)  TIMED UP AND GO:  Was the test performed?  No    Cognitive Function:    01/21/2024    3:06 PM 04/14/2011    3:00 PM  MMSE - Mini Mental State Exam  Not completed: Unable to complete   Orientation to time  5  Orientation to Place  5  Registration  3  Attention/ Calculation  5  Recall  3  Language- name 2 objects  2  Language- repeat  1  Language- follow 3 step command  3  Language- read & follow direction  1  Write a sentence  1  Copy design  1  Total score  30        01/21/2024    3:06 PM  6CIT Screen  What Year? 0 points  What month? 0 points  What time? 0 points  Count back from 20 0 points  Months in reverse 0 points  Repeat phrase 0 points  Total Score 0 points    Immunizations Immunization History  Administered Date(s) Administered   Fluad Quad(high Dose  65+) 09/07/2019   Influenza Split 10/08/2011   Influenza Whole 10/03/2009   Influenza, High Dose Seasonal PF 09/17/2017, 09/07/2019   Influenza,inj,Quad PF,6+ Mos 01/25/2014, 11/06/2015   Influenza-Unspecified 10/17/2010, 01/25/2014, 11/06/2015, 11/03/2016, 09/17/2017, 09/07/2019, 09/21/2022   PFIZER(Purple Top)SARS-COV-2 Vaccination 12/30/2019, 01/19/2020, 09/28/2020, 05/27/2021, 10/25/2021   PNEUMOCOCCAL CONJUGATE-20 01/09/2023   Pfizer(Comirnaty)Fall Seasonal Vaccine 12 years and older 11/12/2023   Pneumococcal Conjugate-13 06/20/2015   Pneumococcal Polysaccharide-23 02/13/2009   Td 08/22/1993, 02/13/2009   Td (Adult),5 Lf Tetanus Toxid, Preservative Free 08/22/1993, 02/13/2009   Tdap 08/10/2014    TDAP status: Up to date  Flu Vaccine status: Due, Education has been provided regarding the importance of this vaccine. Advised may receive this vaccine at local pharmacy or Health Dept. Aware to provide a copy of the vaccination record if obtained from local pharmacy or Health Dept. Verbalized acceptance and understanding.  Pneumococcal vaccine status: Up to date  Covid-19 vaccine status: Completed vaccines  Qualifies for Shingles Vaccine? Yes   Zostavax completed No   Shingrix Completed?: No.    Education has been provided regarding the importance of this vaccine. Patient has been advised to call insurance company to determine out of pocket expense if they have not yet received this vaccine. Advised may also receive vaccine at local pharmacy or Health Dept. Verbalized acceptance and understanding.  Screening Tests Health Maintenance  Topic Date Due   Zoster Vaccines- Shingrix (1 of 2) Never done   INFLUENZA VACCINE  07/23/2023   COVID-19 Vaccine (7 - 2024-25 season) 01/07/2024   DTaP/Tdap/Td (6 - Td or Tdap) 08/10/2024   Medicare Annual Wellness (AWV)  01/20/2025   Pneumonia Vaccine 6+ Years old  Completed   DEXA SCAN  Completed   HPV VACCINES  Aged Out   Colonoscopy   Discontinued    Health Maintenance  Health Maintenance Due  Topic Date Due   Zoster Vaccines- Shingrix (1 of 2) Never done   INFLUENZA VACCINE  07/23/2023   COVID-19 Vaccine (7 - 2024-25 season) 01/07/2024    Colorectal cancer screening: No longer required.   Mammogram status: No longer required due to age.  Bone Density status: Completed 05/08/2020. Results reflect: Bone density results: OSTEOPOROSIS. Repeat every 2 years.  Lung Cancer Screening: (Low Dose CT Chest recommended if Age 36-80 years, 20 pack-year currently smoking OR have quit w/in 15years.) does not qualify.   Lung Cancer Screening Referral: no  Additional Screening:  Hepatitis C Screening: does not qualify.  Vision Screening: Recommended annual ophthalmology exams for early detection of glaucoma and other disorders of the eye. Is the patient up to date with their annual eye exam?  Yes  Who is the provider or what is the name of the office in which the patient attends annual eye exams? Rhona Leavens. Vanetta Mulders, MD. If pt is not established with a provider, would they like to be referred to a provider  to establish care? No .   Dental Screening: Recommended annual dental exams for proper oral hygiene.  Community Resource Referral / Chronic Care Management: CRR required this visit?  No   CCM required this visit?  No     Plan:     I have personally reviewed and noted the following in the patient's chart:   Medical and social history Use of alcohol, tobacco or illicit drugs  Current medications and supplements including opioid prescriptions. Patient is currently taking opioid prescriptions. Information provided to patient regarding non-opioid alternatives. Patient advised to discuss non-opioid treatment plan with their provider. Functional ability and status Nutritional status Physical activity Advanced directives List of other physicians Hospitalizations, surgeries, and ER visits in previous 12  months Vitals Screenings to include cognitive, depression, and falls Referrals and appointments  In addition, I have reviewed and discussed with patient certain preventive protocols, quality metrics, and best practice recommendations. A written personalized care plan for preventive services as well as general preventive health recommendations were provided to patient.     Mickeal Needy, LPN   1/61/0960   After Visit Summary: (MyChart) Due to this being a telephonic visit, the after visit summary with patients personalized plan was offered to patient via MyChart   Nurse Notes: Patient is due for Shingrix, Flu and Covid vaccines.

## 2024-01-21 NOTE — Patient Instructions (Addendum)
Margaret Barton , Thank you for taking time to come for your Medicare Wellness Visit. I appreciate your ongoing commitment to your health goals. Please review the following plan we discussed and let me know if I can assist you in the future.   Referrals/Orders/Follow-Ups/Clinician Recommendations: Yes; Keep maintaining your health by keeping your appointments with Dr. Jennette Kettle and any specialists that you may see.  Call us if you need anything.  Have a great year!!!!  This is a list of the screening recommended for you and due dates:  Health Maintenance  Topic Date Due   Zoster (Shingles) Vaccine (1 of 2) Never done   Flu Shot  07/23/2023   COVID-19 Vaccine (7 - 2024-25 season) 01/07/2024   DTaP/Tdap/Td vaccine (6 - Td or Tdap) 08/10/2024   Medicare Annual Wellness Visit  01/20/2025   Pneumonia Vaccine  Completed   DEXA scan (bone density measurement)  Completed   HPV Vaccine  Aged Out   Colon Cancer Screening  Discontinued    Advanced directives: (Copy Requested) Please bring a copy of your health care power of attorney and living will to the office to be added to your chart at your convenience.  Next Medicare Annual Wellness Visit scheduled for next year: Yes

## 2024-01-25 DIAGNOSIS — M6281 Muscle weakness (generalized): Secondary | ICD-10-CM | POA: Diagnosis not present

## 2024-01-25 DIAGNOSIS — S46011D Strain of muscle(s) and tendon(s) of the rotator cuff of right shoulder, subsequent encounter: Secondary | ICD-10-CM | POA: Diagnosis not present

## 2024-01-25 DIAGNOSIS — M25611 Stiffness of right shoulder, not elsewhere classified: Secondary | ICD-10-CM | POA: Diagnosis not present

## 2024-01-25 DIAGNOSIS — Z9181 History of falling: Secondary | ICD-10-CM | POA: Diagnosis not present

## 2024-01-25 DIAGNOSIS — M25511 Pain in right shoulder: Secondary | ICD-10-CM | POA: Diagnosis not present

## 2024-01-25 DIAGNOSIS — Z79899 Other long term (current) drug therapy: Secondary | ICD-10-CM | POA: Diagnosis not present

## 2024-01-25 DIAGNOSIS — M19011 Primary osteoarthritis, right shoulder: Secondary | ICD-10-CM | POA: Diagnosis not present

## 2024-01-25 DIAGNOSIS — G8929 Other chronic pain: Secondary | ICD-10-CM | POA: Diagnosis not present

## 2024-01-28 ENCOUNTER — Ambulatory Visit
Admission: RE | Admit: 2024-01-28 | Discharge: 2024-01-28 | Disposition: A | Discharge status: Home / Self Care | Payer: Medicare PPO | Source: Ambulatory Visit | Attending: Family Medicine | Admitting: Family Medicine

## 2024-01-28 ENCOUNTER — Encounter: Payer: Self-pay | Admitting: Student

## 2024-01-28 ENCOUNTER — Ambulatory Visit (INDEPENDENT_AMBULATORY_CARE_PROVIDER_SITE_OTHER): Payer: Medicare PPO | Admitting: Student

## 2024-01-28 VITALS — BP 121/49 | HR 74 | Temp 100.8°F | Ht 62.0 in | Wt 133.4 lb

## 2024-01-28 DIAGNOSIS — J9811 Atelectasis: Secondary | ICD-10-CM | POA: Diagnosis not present

## 2024-01-28 DIAGNOSIS — R059 Cough, unspecified: Secondary | ICD-10-CM

## 2024-01-28 DIAGNOSIS — R509 Fever, unspecified: Secondary | ICD-10-CM | POA: Diagnosis not present

## 2024-01-28 DIAGNOSIS — J189 Pneumonia, unspecified organism: Secondary | ICD-10-CM

## 2024-01-28 DIAGNOSIS — J111 Influenza due to unidentified influenza virus with other respiratory manifestations: Secondary | ICD-10-CM

## 2024-01-28 DIAGNOSIS — R011 Cardiac murmur, unspecified: Secondary | ICD-10-CM

## 2024-01-28 DIAGNOSIS — R079 Chest pain, unspecified: Secondary | ICD-10-CM | POA: Diagnosis not present

## 2024-01-28 LAB — POC SOFIA 2 FLU + SARS ANTIGEN FIA
Influenza A, POC: POSITIVE — AB
Influenza B, POC: NEGATIVE
SARS Coronavirus 2 Ag: NEGATIVE

## 2024-01-28 MED ORDER — AMOXICILLIN-POT CLAVULANATE 875-125 MG PO TABS: 1.0000 | ORAL_TABLET | Freq: Two times a day (BID) | ORAL | Status: AC

## 2024-01-28 MED ORDER — OSELTAMIVIR PHOSPHATE 75 MG PO CAPS: 75.0000 mg | ORAL_CAPSULE | Freq: Two times a day (BID) | ORAL | 0 refills | Status: AC

## 2024-01-28 MED ORDER — AMOXICILLIN-POT CLAVULANATE 875-125 MG PO TABS: 1.0000 | ORAL_TABLET | Freq: Two times a day (BID) | ORAL | 0 refills | Status: DC

## 2024-01-28 MED ORDER — AZITHROMYCIN 500 MG PO TABS: 500.0000 mg | ORAL_TABLET | Freq: Every day | ORAL | 0 refills | Status: DC

## 2024-01-28 MED ORDER — AZITHROMYCIN 500 MG PO TABS: 500.0000 mg | ORAL_TABLET | Freq: Every day | ORAL | Status: AC

## 2024-01-28 NOTE — Progress Notes (Signed)
    SUBJECTIVE:   CHIEF COMPLAINT / HPI:   Sick Symptoms started 2 days ago with productive cough with yellow sputum, decreased appetite, rhinorrhea and temp started last night with Tmax 102. Some SOB last night but has improved. Still drinking fluids. Taking tylenol  prn.  PERTINENT  PMH / PSH: Non pertinent   OBJECTIVE:   BP (!) 121/49   Pulse 74   Temp (!) 100.8 F (38.2 C) (Oral)   Ht 5' 2 (1.575 m)   Wt 133 lb 6.4 oz (60.5 kg)   SpO2 96%   BMI 24.40 kg/m    General: NAD, pleasant, able to participate in exam HEENT: Mildly injected conjunctiva bilaterally, MMM, no erythema or exudate of oropharynx, no cervical or submandibular lymphadenopathy Cardiac: RRR, soft systolic murmur Respiratory: Good air movement. Mild intermittent crackles in upper lobes, rhonci in R upper lobe, rhonci and crackles in lower lobes Skin: warm and dry Neuro: alert, no obvious focal deficits Psych: Normal affect and mood  ASSESSMENT/PLAN:   Pneumonia of both lungs due to infectious organism Patient tested positive for flu A today.  Likely has a viral pneumonia.  Given her age and lung sounds, could also consider multifocal bacterial pneumonia and will treat with antibiotics along with Tamiflu .  Reassuringly, she is overall well-appearing and vital signs stable, breathing comfortably. -CXR -Tamiflu  for 5 days -Rx Augmentin  and azithromycin  -ED precautions discussed, also notified  pt's son per her request and discussed with him as well  Murmur, cardiac Soft systolic murmur most likely d/t acute illness.  Patient advised to return in about a week when she is feeling better.  If murmur still present can consider echo     Dr. Lauraine Molt, DO [facility]

## 2024-01-28 NOTE — Patient Instructions (Addendum)
 It was great to see you! Thank you for allowing me to participate in your care!   Our plans for today:  - Go to [address] for a chest x ray today. You do not need an appointment - Drink hot tea with honey for the cough. You can try over the counter mucinex for the mucus if you would like but stay hydrated - I have sent in two antibiotics for you. Please complete the full course and return in 1 week for a follow up visit or sooner if your symptoms worsen and you feel you cannot breath well  -I also sent in an anti viral medication called tamiflu   - you have a new heart mumur which is likely due to being sick. If it is still there at the follow up apt we can get an ultrasound of the heart    Take care and seek immediate care sooner if you develop any concerns.   Dr. Lauraine Molt, DO [facility]

## 2024-01-29 DIAGNOSIS — J189 Pneumonia, unspecified organism: Secondary | ICD-10-CM | POA: Insufficient documentation

## 2024-01-29 DIAGNOSIS — R011 Cardiac murmur, unspecified: Secondary | ICD-10-CM | POA: Insufficient documentation

## 2024-01-29 NOTE — Assessment & Plan Note (Signed)
 Soft systolic murmur most likely d/t acute illness.  Patient advised to return in about a week when she is feeling better.  If murmur still present can consider echo

## 2024-01-29 NOTE — Assessment & Plan Note (Addendum)
 Patient tested positive for flu A today.  Likely has a viral pneumonia.  Given her age and lung sounds, could also consider multifocal bacterial pneumonia and will treat with antibiotics along with Tamiflu .  Reassuringly, she is overall well-appearing and vital signs stable, breathing comfortably. -CXR -Tamiflu  for 5 days -Rx Augmentin  and azithromycin  -ED precautions discussed, also notified  pt's son per her request and discussed with him as well

## 2024-02-01 ENCOUNTER — Telehealth: Payer: Self-pay | Admitting: Student

## 2024-02-01 NOTE — Telephone Encounter (Signed)
 Called pt to discuss CXR results which do not show any focal consolidation indicating bacterial PNA. Did not leave detailed message but asked pt to call the office back. If she is feeling better, can consider shortening course of amoxicillin  to only 5 days.

## 2024-02-04 ENCOUNTER — Encounter: Payer: Self-pay | Admitting: Student

## 2024-02-04 ENCOUNTER — Ambulatory Visit (INDEPENDENT_AMBULATORY_CARE_PROVIDER_SITE_OTHER): Payer: Medicare PPO | Admitting: Student

## 2024-02-04 VITALS — BP 158/73 | HR 65 | Ht 62.0 in | Wt 127.8 lb

## 2024-02-04 DIAGNOSIS — R051 Acute cough: Secondary | ICD-10-CM

## 2024-02-04 DIAGNOSIS — R197 Diarrhea, unspecified: Secondary | ICD-10-CM

## 2024-02-04 DIAGNOSIS — R030 Elevated blood-pressure reading, without diagnosis of hypertension: Secondary | ICD-10-CM

## 2024-02-04 DIAGNOSIS — J189 Pneumonia, unspecified organism: Secondary | ICD-10-CM | POA: Diagnosis not present

## 2024-02-04 DIAGNOSIS — R011 Cardiac murmur, unspecified: Secondary | ICD-10-CM

## 2024-02-04 MED ORDER — HYDROCODONE BIT-HOMATROP MBR 5-1.5 MG/5ML PO SOLN: 5.0000 mL | Freq: Four times a day (QID) | ORAL | 0 refills | Status: DC | PRN

## 2024-02-04 NOTE — Patient Instructions (Signed)
It was great to see you! Thank you for allowing me to participate in your care!  I recommend that you always bring your medications to each appointment as this makes it easy to ensure you are on the correct medications and helps [place] not miss when refills are needed.  Our plans for today:  - Cough syrup sent to pharmacy  - If diarrhea does not improve over the next week, please return  - Go to apt for [place] of your heart to evaluate heart murmur   We are checking some labs today, I will call you if they are abnormal will send you a MyChart message or a letter if they are normal.  If you do not hear about your labs in the next 2 weeks please let us know.  Take care and seek immediate care sooner if you develop any concerns.   Dr. Erick Alley, DO [facility]

## 2024-02-04 NOTE — Progress Notes (Unsigned)
    SUBJECTIVE:   CHIEF COMPLAINT / HPI:   Flu pneumonia Seen last on 01/28/2024, positive for flu A.  Given age and lung sounds, was also treated for possible multifocal pneumonia with Augmentin and azithromycin. Today states she is still having productive cough that is keeping her up at night. No SOB. Still has decreased appetite, is drinking plenty of fluids. Has tried mucinex which didn't help. Hasn't had a fever in 3 days. Started having watery diarrhea 2 days after starting abx, was having diarrhea 6x/day. Finished abx yesterday and diarrhea has slowed down, had 2 episodes today.   PERTINENT  PMH / PSH: ***  OBJECTIVE:   BP (!) 152/60   Pulse 65   Ht 5\' 2"  (1.575 m)   Wt 127 lb 12.8 oz (58 kg)   SpO2 98%   BMI 23.37 kg/m    General: NAD, pleasant, able to participate in exam Cardiac: RRR, no murmurs. Respiratory: CTAB, normal effort, No wheezes, rales or rhonchi Abdomen: Bowel sounds present, nontender, nondistended, no hepatosplenomegaly. Extremities: no edema or cyanosis. Skin: warm and dry, no rashes noted Neuro: alert, no obvious focal deficits Psych: Normal affect and mood  ASSESSMENT/PLAN:   No problem-specific Assessment & Plan notes found for this encounter.     Dr. Erick Alley, DO [facility]    {    This will disappear when note is signed, click to select method of visit    :1}

## 2024-02-05 ENCOUNTER — Encounter: Payer: Self-pay | Admitting: Student

## 2024-02-05 DIAGNOSIS — R197 Diarrhea, unspecified: Secondary | ICD-10-CM | POA: Insufficient documentation

## 2024-02-05 DIAGNOSIS — R030 Elevated blood-pressure reading, without diagnosis of hypertension: Secondary | ICD-10-CM | POA: Insufficient documentation

## 2024-02-05 DIAGNOSIS — R051 Acute cough: Secondary | ICD-10-CM | POA: Insufficient documentation

## 2024-02-05 LAB — BASIC METABOLIC PANEL
BUN/Creatinine Ratio: 15 (ref 12–28)
BUN: 11 mg/dL (ref 8–27)
CO2: 21 mmol/L (ref 20–29)
Calcium: 9.2 mg/dL (ref 8.7–10.3)
Chloride: 101 mmol/L (ref 96–106)
Creatinine, Ser: 0.71 mg/dL (ref 0.57–1.00)
Glucose: 84 mg/dL (ref 70–99)
Potassium: 3.8 mmol/L (ref 3.5–5.2)
Sodium: 138 mmol/L (ref 134–144)
eGFR: 86 mL/min/{1.73_m2} (ref >59)

## 2024-02-05 NOTE — Assessment & Plan Note (Addendum)
Symptoms improved after abx and Tamiflu.  Previous CXR more concerning for a viral pneumonia consistent with flu A.  Cough keeping her up at night.  Reassuringly, she is breathing comfortably on room air.  SpO2 in high 90s. -Hycodan cough syrup refilled -Return/ED precautions discussed

## 2024-02-05 NOTE — Assessment & Plan Note (Addendum)
Soft systolic murmur still present on exam.  Given age, could be aortic stenosis. -Echocardiogram ordered

## 2024-02-05 NOTE — Assessment & Plan Note (Signed)
Patient advised to return for BP follow-up with PCP once she is no longer acutely ill.

## 2024-02-05 NOTE — Assessment & Plan Note (Signed)
Likely side effect of antibiotics and reassuringly, seems to be slowing down.  She appears hydrated on exam.  No abdominal pain. -CTM -BMP to check electrolytes -If diarrhea persists/worsens can consider testing for C. difficile

## 2024-02-08 DIAGNOSIS — M25511 Pain in right shoulder: Secondary | ICD-10-CM | POA: Diagnosis not present

## 2024-02-08 DIAGNOSIS — M25611 Stiffness of right shoulder, not elsewhere classified: Secondary | ICD-10-CM | POA: Diagnosis not present

## 2024-02-08 DIAGNOSIS — Z79899 Other long term (current) drug therapy: Secondary | ICD-10-CM | POA: Diagnosis not present

## 2024-02-08 DIAGNOSIS — Z9181 History of falling: Secondary | ICD-10-CM | POA: Diagnosis not present

## 2024-02-08 DIAGNOSIS — S46011D Strain of muscle(s) and tendon(s) of the rotator cuff of right shoulder, subsequent encounter: Secondary | ICD-10-CM | POA: Diagnosis not present

## 2024-02-08 DIAGNOSIS — M19011 Primary osteoarthritis, right shoulder: Secondary | ICD-10-CM | POA: Diagnosis not present

## 2024-02-08 DIAGNOSIS — G8929 Other chronic pain: Secondary | ICD-10-CM | POA: Diagnosis not present

## 2024-02-08 DIAGNOSIS — M6281 Muscle weakness (generalized): Secondary | ICD-10-CM | POA: Diagnosis not present

## 2024-02-10 DIAGNOSIS — Z79899 Other long term (current) drug therapy: Secondary | ICD-10-CM | POA: Diagnosis not present

## 2024-02-10 DIAGNOSIS — Z9181 History of falling: Secondary | ICD-10-CM | POA: Diagnosis not present

## 2024-02-10 DIAGNOSIS — M25611 Stiffness of right shoulder, not elsewhere classified: Secondary | ICD-10-CM | POA: Diagnosis not present

## 2024-02-10 DIAGNOSIS — S46011D Strain of muscle(s) and tendon(s) of the rotator cuff of right shoulder, subsequent encounter: Secondary | ICD-10-CM | POA: Diagnosis not present

## 2024-02-10 DIAGNOSIS — M25511 Pain in right shoulder: Secondary | ICD-10-CM | POA: Diagnosis not present

## 2024-02-10 DIAGNOSIS — M19011 Primary osteoarthritis, right shoulder: Secondary | ICD-10-CM | POA: Diagnosis not present

## 2024-02-10 DIAGNOSIS — M6281 Muscle weakness (generalized): Secondary | ICD-10-CM | POA: Diagnosis not present

## 2024-02-10 DIAGNOSIS — G8929 Other chronic pain: Secondary | ICD-10-CM | POA: Diagnosis not present

## 2024-02-12 ENCOUNTER — Ambulatory Visit (HOSPITAL_COMMUNITY): Payer: Medicare PPO

## 2024-02-22 DIAGNOSIS — M6281 Muscle weakness (generalized): Secondary | ICD-10-CM | POA: Diagnosis not present

## 2024-02-22 DIAGNOSIS — M25611 Stiffness of right shoulder, not elsewhere classified: Secondary | ICD-10-CM | POA: Diagnosis not present

## 2024-02-22 DIAGNOSIS — M25511 Pain in right shoulder: Secondary | ICD-10-CM | POA: Diagnosis not present

## 2024-02-22 DIAGNOSIS — Z79899 Other long term (current) drug therapy: Secondary | ICD-10-CM | POA: Diagnosis not present

## 2024-02-22 DIAGNOSIS — G8929 Other chronic pain: Secondary | ICD-10-CM | POA: Diagnosis not present

## 2024-02-22 DIAGNOSIS — M19011 Primary osteoarthritis, right shoulder: Secondary | ICD-10-CM | POA: Diagnosis not present

## 2024-02-22 DIAGNOSIS — Z9181 History of falling: Secondary | ICD-10-CM | POA: Diagnosis not present

## 2024-02-22 DIAGNOSIS — S46011D Strain of muscle(s) and tendon(s) of the rotator cuff of right shoulder, subsequent encounter: Secondary | ICD-10-CM | POA: Diagnosis not present

## 2024-02-26 ENCOUNTER — Encounter: Payer: Self-pay | Admitting: Student

## 2024-02-26 ENCOUNTER — Telehealth: Payer: Self-pay | Admitting: Student

## 2024-02-26 ENCOUNTER — Telehealth: Payer: Self-pay

## 2024-02-26 ENCOUNTER — Ambulatory Visit (HOSPITAL_COMMUNITY)
Admission: RE | Admit: 2024-02-26 | Discharge: 2024-02-26 | Disposition: A | Discharge status: Home / Self Care | Payer: Medicare PPO | Source: Ambulatory Visit | Attending: Family Medicine | Admitting: Family Medicine

## 2024-02-26 ENCOUNTER — Telehealth: Payer: Self-pay | Admitting: Family Medicine

## 2024-02-26 DIAGNOSIS — I517 Cardiomegaly: Secondary | ICD-10-CM | POA: Insufficient documentation

## 2024-02-26 DIAGNOSIS — R000 Tachycardia, unspecified: Secondary | ICD-10-CM | POA: Insufficient documentation

## 2024-02-26 DIAGNOSIS — R011 Cardiac murmur, unspecified: Secondary | ICD-10-CM | POA: Diagnosis not present

## 2024-02-26 DIAGNOSIS — E785 Hyperlipidemia, unspecified: Secondary | ICD-10-CM | POA: Diagnosis not present

## 2024-02-26 DIAGNOSIS — I4891 Unspecified atrial fibrillation: Secondary | ICD-10-CM | POA: Insufficient documentation

## 2024-02-26 LAB — ECHOCARDIOGRAM COMPLETE
AR max vel: 1.67 cm2
AV Area VTI: 1.65 cm2
AV Area mean vel: 1.6 cm2
AV Mean grad: 3 mmHg
AV Peak grad: 5.3 mmHg
Ao pk vel: 1.15 m/s
Area-P 1/2: 3.99 cm2
S' Lateral: 2.87 cm

## 2024-02-26 MED ORDER — TRAMADOL HCL 50 MG PO TABS: ORAL_TABLET | ORAL | 5 refills | Status: DC

## 2024-02-26 MED ORDER — APIXABAN 2.5 MG PO TABS: 2.5000 mg | ORAL_TABLET | Freq: Two times a day (BID) | ORAL | 3 refills | Status: DC

## 2024-02-26 NOTE — Assessment & Plan Note (Signed)
 Will place her on anticoagulation, cardiology referral.

## 2024-02-26 NOTE — Progress Notes (Signed)
*  PRELIMINARY RESULTS* Echocardiogram 2D Echocardiogram has been performed.  Laddie Aquas 02/26/2024, 10:42 AM

## 2024-02-26 NOTE — Telephone Encounter (Signed)
 Spoke with patient regarding her echocardiogram.  Shows mildly reduced ejection fraction.  The more important thing in my perspective and what I told her was that it seems she is having some atrial fibrillation.  When I spoke with her, she said that she has noticed some irregular heart rate with a racing several times in the last 2 to 3 weeks.  Really  does not bother her but she can tell when she feels her pulse.  She is also not quite gotten her energy level back after the flu.  - I am going to place her on anticoagulation.   - I will put in a cardiology referral for her next week.  Consider Zio patch either through our clinic or through cardiology as they will likely want to know how much time she is spending in atrial fibrillation.  If she has extended periods of heart racing over the weekend, she will call me on my cell.  Would try some rate control.  She has not had any chest pains per se.  She also wants a refill on her tramadol because her hips really been bothering her with arthritis.  I will call that in as well.  I double checked and no interactions with Eliquis and tramadol together.  Also her kidney function is excellent so we will continue her current dose of tramadol although she rarely uses more than 3 or 4 a day.

## 2024-02-26 NOTE — Telephone Encounter (Signed)
 Patient calls nurse line return phone call from Dr. Yetta Barre.   She reports she was told to call back to discuss ECHO results.   Advised patient to keep her phone on her.

## 2024-02-29 NOTE — Telephone Encounter (Signed)
 error

## 2024-03-01 ENCOUNTER — Ambulatory Visit (INDEPENDENT_AMBULATORY_CARE_PROVIDER_SITE_OTHER): Payer: Medicare PPO | Admitting: Sports Medicine

## 2024-03-01 VITALS — BP 124/80 | Ht 62.0 in | Wt 127.0 lb

## 2024-03-01 DIAGNOSIS — S46011D Strain of muscle(s) and tendon(s) of the rotator cuff of right shoulder, subsequent encounter: Secondary | ICD-10-CM

## 2024-03-01 DIAGNOSIS — R011 Cardiac murmur, unspecified: Secondary | ICD-10-CM | POA: Diagnosis not present

## 2024-03-01 DIAGNOSIS — M75121 Complete rotator cuff tear or rupture of right shoulder, not specified as traumatic: Secondary | ICD-10-CM | POA: Insufficient documentation

## 2024-03-01 NOTE — Assessment & Plan Note (Signed)
 Patient has made great progress with physical therapy She is happy with the result and we will continue her own home exercises and normal activities as tolerated Follow-up as needed for symptoms

## 2024-03-01 NOTE — Assessment & Plan Note (Signed)
 Her echocardiogram was abnormal with a decreased ejection fraction and possible atrial fibrillation  She has to follow-up with cardiology for this EKG and rhythm strip were normal today except for sinus bradycardia

## 2024-03-01 NOTE — Progress Notes (Signed)
 Follow-up of complete rotator cuff tear  Patient was diagnosed by ultrasound with a complete tear of her supraspinatus and infraspinatus muscles.  She has arthritic change in the right shoulder as well. We treated this conservatively and that was her wish.  She has been going to physical therapy and has made great progress with her range of motion.  While she has some persistent weakness she is able to do almost all of her normal activities with her right arm.  She comes for follow-up of this.  Incidentally since I last saw her she wound up having an echo evaluation suggesting a decreased ejection fraction and some possible atrial fibrillation.  Physical exam  Pleasant elderly female in no acute distress BP 124/80   Ht 5\' 2"  (1.575 m)   Wt 127 lb (57.6 kg)   BMI 23.23 kg/m  Note her heart rate was irregular when she first came in so we went ahead to see if she was having some atrial fibrillation with an EKG and rhythm strip Be show normal sinus rhythm with a rate of 56 I did not note any atrial fibrillation  I was not able to hear the murmur that she had been diagnosed with before but hearts findings otherwise were unremarkable on exam  Shoulder evaluation shows: Significant increase in forward flexion to where the right is only 5 to 10 degrees less than the left Back scratch is now to about the L5 level on the right which is 3 to 4 inches less than the left She has good external and internal rotation Strength testing reveals she has significant weakness in external rotation She has significant weakness and elevation and with the empty can test Abduction to 90 degrees is still strong Biceps shows a complete rupture on the right with retraction of the muscle but with some good strength on elbow flexion

## 2024-03-02 DIAGNOSIS — M25611 Stiffness of right shoulder, not elsewhere classified: Secondary | ICD-10-CM | POA: Diagnosis not present

## 2024-03-02 DIAGNOSIS — M19011 Primary osteoarthritis, right shoulder: Secondary | ICD-10-CM | POA: Diagnosis not present

## 2024-03-02 DIAGNOSIS — M6281 Muscle weakness (generalized): Secondary | ICD-10-CM | POA: Diagnosis not present

## 2024-03-02 DIAGNOSIS — Z79899 Other long term (current) drug therapy: Secondary | ICD-10-CM | POA: Diagnosis not present

## 2024-03-02 DIAGNOSIS — S46011D Strain of muscle(s) and tendon(s) of the rotator cuff of right shoulder, subsequent encounter: Secondary | ICD-10-CM | POA: Diagnosis not present

## 2024-03-02 DIAGNOSIS — M25511 Pain in right shoulder: Secondary | ICD-10-CM | POA: Diagnosis not present

## 2024-03-02 DIAGNOSIS — G8929 Other chronic pain: Secondary | ICD-10-CM | POA: Diagnosis not present

## 2024-03-02 DIAGNOSIS — Z9181 History of falling: Secondary | ICD-10-CM | POA: Diagnosis not present

## 2024-03-04 NOTE — Progress Notes (Signed)
 Cardiology Office Note:    Date:  03/07/2024   ID:  Margaret Barton, DOB 07/23/1941, MRN [id number]  PCP:  Nestor Ramp, MD   [facility] Providers Cardiologist:  None     Referring MD: Nestor Ramp, MD   Chief Complaint  Patient presents with   Atrial Flutter    History of Present Illness:    Margaret Barton is a 83 y.o. female seen at the request of Dr Jennette Kettle for evaluation of Atrial flutter and CHF. She was treated in early Feb for the flu with viral PNA. Noted  to have a soft systolic murmur and sent for an Echo. This demonstrated reduced EF of 40-45% with global HK. Also noted to be in atrial flutter during exam. Was started on Eliquis - lower dose for age and weight. Notes with PNA she really felt poor for 2 weeks but then got better. Has not been aware of arrhythmia. No chest pain or SOB. No prior cardiac history. Denies CM, HTN, or history of CVA.   History reviewed. No pertinent past medical history.  Past Surgical History:  Procedure Laterality Date   ABDOMINAL HYSTERECTOMY     TOTAL HIP ARTHROPLASTY     Right   WRIST SURGERY Right     Current Medications: Current Meds  Medication Sig   apixaban (ELIQUIS) 2.5 MG TABS tablet Take 1 tablet (2.5 mg total) by mouth 2 (two) times daily.   Diclofenac-miSOPROStol 75-0.2 MG TBEC TAKE 1 TABLET BY MOUTH TWICE DAILY AS NEEDED FOR PAIN   estradiol (ESTRADERM) 0.1 MG/24HR patch Place 1 patch (0.1 mg total) onto the skin 2 (two) times a week.   simvastatin (ZOCOR) 40 MG tablet TAKE 1 TABLET(40 MG) BY MOUTH AT BEDTIME   traMADol (ULTRAM) 50 MG tablet Take one or two tablets three times a day as needed for pain max of 6 tabs in 24 hours     Allergies:   Keflex [cephalexin]   Social History   Socioeconomic History   Marital status: Married    Spouse name: Rosanne Ashing   Number of children: 2   Years of education: Not on file   Highest education level: Not on file  Occupational History   Occupation: Professor    Employer:  [employer]   Occupation: CELL  [id number]  Tobacco Use   Smoking status: Never   Smokeless tobacco: Never  Vaping Use   Vaping status: Never Used  Substance and Sexual Activity   Alcohol use: Yes    Alcohol/week: 4.0 standard drinks of alcohol    Types: 4 drink(s) per week   Drug use: No   Sexual activity: Yes  Other Topics Concern   Not on file  Social History Narrative   Health Care POA: Husband, Laurrie Toppin   Emergency Contact: Dr. Denny Levy   End of Life Plan:    Who lives with you: Lives with husband on 20 acres of property   Any pets: 1 german shepard, 3 cats   Diet: Patient has a varied diet of protein, starch, and fruits and vegetables   Exercise: Patient exercises 4-5 times a week.   Seatbelts: Patient reports wearing seat belt when in vehicle.   Wynelle Link Exposure/Protection: Patient reports wear sun screen daily.   Hobbies: Gardening, sports, teaching, cooking            Social Drivers of Health   Financial Resource Strain: Low Risk  (01/21/2024)   Overall Physicist, medical Strain (CARDIA)  Difficulty of Paying Living Expenses: Not hard at all  Food Insecurity: No Food Insecurity (01/21/2024)   Hunger Vital Sign    Worried About Running Out of Food in the Last Year: Never true    Ran Out of Food in the Last Year: Never true  Transportation Needs: No Transportation Needs (01/21/2024)   PRAPARE - Administrator, Civil Service (Medical): No    Lack of Transportation (Non-Medical): No  Physical Activity: Sufficiently Active (01/21/2024)   Exercise Vital Sign    Days of Exercise per Week: 7 days    Minutes of Exercise per Session: 30 min  Stress: No Stress Concern Present (01/21/2024)   [employer] of Occupational Health - Occupational Stress Questionnaire    Feeling of Stress : Not at all  Social Connections: Socially Integrated (01/21/2024)   Social Connection and Isolation Panel [NHANES]    Frequency of Communication with Friends and Family:  More than three times a week    Frequency of Social Gatherings with Friends and Family: More than three times a week    Attends Religious Services: More than 4 times per year    Active Member of [employer] or Organizations: Yes    Attends Banker Meetings: 1 to 4 times per year    Marital Status: Married     Family History: The patient's family history includes COPD in her father; Diabetes in her mother.  ROS:   Please see the history of present illness.     All other systems reviewed and are negative.  EKGs/Labs/Other Studies Reviewed:    The following studies were reviewed today: Echo 02/26/24: IMPRESSIONS     1. Left ventricular ejection fraction, by estimation, is 40 to 45%. The  left ventricle has mildly decreased function. The left ventricle  demonstrates global hypokinesis. There is mild concentric left ventricular  hypertrophy. Left ventricular diastolic  parameters are indeterminate.   2. Right ventricular systolic function is normal. The right ventricular  size is normal.   3. The mitral valve is normal in structure. No evidence of mitral valve  regurgitation. No evidence of mitral stenosis.   4. The aortic valve is normal in structure. Aortic valve regurgitation is  not visualized. Aortic valve sclerosis/calcification is present, without  any evidence of aortic stenosis. Aortic valve area, by VTI measures 1.65  cm. Aortic valve mean gradient  measures 3.0 mmHg.   5. The inferior vena cava is normal in size with greater than 50%  respiratory variability, suggesting right atrial pressure of 3 mmHg.   Conclusion(s)/Recommendation(s): LVEF mild to moderately reduced (40-45%)  in setting of probable atrial flutter. Have notified ordering physician.        Recent Labs: 08/26/2023: ALT 21 02/04/2024: BUN 11; Creatinine, Ser 0.71; Potassium 3.8; Sodium 138  Recent Lipid Panel    Component Value Date/Time   CHOL 174 08/26/2023 0919   TRIG 115 08/26/2023 0919    HDL 71 08/26/2023 0919   CHOLHDL 2.5 08/26/2023 0919   CHOLHDL 2.7 06/25/2016 0921   VLDL 29 06/25/2016 0921   LDLCALC 83 08/26/2023 0919     Risk Assessment/Calculations:    CHA2DS2-VASc Score = 3   This indicates a 3.2% annual risk of stroke. The patient's score is based upon: CHF History: 0 HTN History: 0 Diabetes History: 0 Stroke History: 0 Vascular Disease History: 0 Age Score: 2 Gender Score: 1       Physical Exam:    VS:  BP (!) 140/58  Pulse 64   Ht 5\' 2"  (1.575 m)   Wt 130 lb (59 kg)   SpO2 99%   BMI 23.78 kg/m     Wt Readings from Last 3 Encounters:  03/07/24 130 lb (59 kg)  03/01/24 127 lb (57.6 kg)  02/04/24 127 lb 12.8 oz (58 kg)     GEN:  Well nourished, well developed in no acute distress HEENT: Normal NECK: No JVD; No carotid bruits LYMPHATICS: No lymphadenopathy CARDIAC: RRR, no murmurs, rubs, gallops RESPIRATORY:  Clear to auscultation without rales, wheezing or rhonchi  ABDOMEN: Soft, non-tender, non-distended MUSCULOSKELETAL:  No edema; No deformity  SKIN: Warm and dry NEUROLOGIC:  Alert and oriented x 3 PSYCHIATRIC:  Normal affect   ASSESSMENT:    1. Atrial fibrillation, unspecified type (HCC)   2. Typical atrial flutter (HCC)   3. Acute systolic CHF (congestive heart failure) (HCC)    PLAN:    In order of problems listed above:  Atrial flutter in setting of influenza infection and PNA. Likely triggered by acute illness. Confirmed back in NSR by Ecg per Dr Darrick Penna on 03/01/24. Recommend we have her wear a 2 week Zio patch monitor. If no recurrent arrhythmia I would recommend stopping Eliquis unless she has recurrence. If she does have some Afib/flutter we will continue LV dysfunction noted on Echo. Asymptomatic. I suspect this is more related to the Atrial flutter. Will consider repeating Echo in another month            Medication Adjustments/Labs and Tests Ordered: Current medicines are reviewed at length with the patient  today.  Concerns regarding medicines are outlined above.  Orders Placed This Encounter  Procedures   LONG TERM MONITOR (3-14 DAYS)   No orders of the defined types were placed in this encounter.   Patient Instructions  Medication Instructions:  Continue same medications   Lab Work: None ordered   Testing/Procedures: 2 week heart monitor  ( ZIO )    Follow-Up: At [facility], you and your health needs are our priority.  As part of our continuing mission to provide you with exceptional heart care, we have created designated Provider Care Teams.  These Care Teams include your primary Cardiologist (physician) and Advanced Practice Providers (APPs -  Physician Assistants and Nurse Practitioners) who all work together to provide you with the care you need, when you need it.  We recommend signing up for the patient portal called "MyChart".  Sign up information is provided on this After Visit Summary.  MyChart is used to connect with patients for Virtual Visits (Telemedicine).  Patients are able to view lab/test results, encounter notes, upcoming appointments, etc.  Non-urgent messages can be sent to your provider as well.   To learn more about what you can do with MyChart, go to [email or web address].    Your next appointment:  To Be Determined    Provider:  Dr.Kris No           Signed, Deeanna Beightol [place], MD  03/07/2024 3:00 PM    [facility]

## 2024-03-07 ENCOUNTER — Ambulatory Visit: Attending: Cardiology | Admitting: Cardiology

## 2024-03-07 ENCOUNTER — Encounter: Payer: Self-pay | Admitting: Cardiology

## 2024-03-07 ENCOUNTER — Ambulatory Visit (INDEPENDENT_AMBULATORY_CARE_PROVIDER_SITE_OTHER)

## 2024-03-07 VITALS — BP 140/58 | HR 64 | Ht 62.0 in | Wt 130.0 lb

## 2024-03-07 DIAGNOSIS — I5021 Acute systolic (congestive) heart failure: Secondary | ICD-10-CM

## 2024-03-07 DIAGNOSIS — Z79899 Other long term (current) drug therapy: Secondary | ICD-10-CM | POA: Diagnosis not present

## 2024-03-07 DIAGNOSIS — G8929 Other chronic pain: Secondary | ICD-10-CM | POA: Diagnosis not present

## 2024-03-07 DIAGNOSIS — Z9181 History of falling: Secondary | ICD-10-CM | POA: Diagnosis not present

## 2024-03-07 DIAGNOSIS — I483 Typical atrial flutter: Secondary | ICD-10-CM

## 2024-03-07 DIAGNOSIS — M6281 Muscle weakness (generalized): Secondary | ICD-10-CM | POA: Diagnosis not present

## 2024-03-07 DIAGNOSIS — M19011 Primary osteoarthritis, right shoulder: Secondary | ICD-10-CM | POA: Diagnosis not present

## 2024-03-07 DIAGNOSIS — S46011D Strain of muscle(s) and tendon(s) of the rotator cuff of right shoulder, subsequent encounter: Secondary | ICD-10-CM | POA: Diagnosis not present

## 2024-03-07 DIAGNOSIS — M25511 Pain in right shoulder: Secondary | ICD-10-CM | POA: Diagnosis not present

## 2024-03-07 DIAGNOSIS — M25611 Stiffness of right shoulder, not elsewhere classified: Secondary | ICD-10-CM | POA: Diagnosis not present

## 2024-03-07 NOTE — Patient Instructions (Signed)
 Medication Instructions:  Continue same medications   Lab Work: None ordered   Testing/Procedures: 2 week heart monitor  ( ZIO )    Follow-Up: At [facility], you and your health needs are our priority.  As part of our continuing mission to provide you with exceptional heart care, we have created designated Provider Care Teams.  These Care Teams include your primary Cardiologist (physician) and Advanced Practice Providers (APPs -  Physician Assistants and Nurse Practitioners) who all work together to provide you with the care you need, when you need it.  We recommend signing up for the patient portal called "MyChart".  Sign up information is provided on this After Visit Summary.  MyChart is used to connect with patients for Virtual Visits (Telemedicine).  Patients are able to view lab/test results, encounter notes, upcoming appointments, etc.  Non-urgent messages can be sent to your provider as well.   To learn more about what you can do with MyChart, go to [email or web address].    Your next appointment:  To Be Determined    Provider:  Dr.Jordan

## 2024-03-07 NOTE — Progress Notes (Unsigned)
 Enrolled for [employer] to mail a ZIO XT long term holter monitor to the patients address on file.

## 2024-03-09 DIAGNOSIS — M25511 Pain in right shoulder: Secondary | ICD-10-CM | POA: Diagnosis not present

## 2024-03-09 DIAGNOSIS — M25611 Stiffness of right shoulder, not elsewhere classified: Secondary | ICD-10-CM | POA: Diagnosis not present

## 2024-03-09 DIAGNOSIS — Z9181 History of falling: Secondary | ICD-10-CM | POA: Diagnosis not present

## 2024-03-09 DIAGNOSIS — Z79899 Other long term (current) drug therapy: Secondary | ICD-10-CM | POA: Diagnosis not present

## 2024-03-09 DIAGNOSIS — M19011 Primary osteoarthritis, right shoulder: Secondary | ICD-10-CM | POA: Diagnosis not present

## 2024-03-09 DIAGNOSIS — S46011D Strain of muscle(s) and tendon(s) of the rotator cuff of right shoulder, subsequent encounter: Secondary | ICD-10-CM | POA: Diagnosis not present

## 2024-03-09 DIAGNOSIS — M6281 Muscle weakness (generalized): Secondary | ICD-10-CM | POA: Diagnosis not present

## 2024-03-09 DIAGNOSIS — G8929 Other chronic pain: Secondary | ICD-10-CM | POA: Diagnosis not present

## 2024-03-16 ENCOUNTER — Ambulatory Visit: Payer: Medicare PPO | Admitting: Family Medicine

## 2024-03-16 VITALS — BP 135/59 | HR 56 | Ht 62.0 in | Wt 128.6 lb

## 2024-03-16 DIAGNOSIS — R030 Elevated blood-pressure reading, without diagnosis of hypertension: Secondary | ICD-10-CM

## 2024-03-17 NOTE — Progress Notes (Signed)
    CHIEF COMPLAINT / HPI: Follow-up for blood pressure.  She is following up with cardiology and wearing a heart monitor.   PERTINENT  PMH / PSH: I have reviewed the patient's medications, allergies, past medical and surgical history, smoking status and updated in the EMR as appropriate.   OBJECTIVE:  BP (!) 135/59   Pulse (!) 56   Ht 5\' 2"  (1.575 m)   Wt 128 lb 9.6 oz (58.3 kg)   SpO2 99%   BMI 23.52 kg/m   . ASSESSMENT / PLAN:   No problem-specific Assessment & Plan notes found for this encounter.   Denny Levy MD

## 2024-03-17 NOTE — Assessment & Plan Note (Signed)
 Normotensive today.Repeat blood pressure today in good range.  She still has a wide pulse pressure.   follow her up in 3 to 6 months.  She will also continue to follow-up with cardiology for her potential A-fib.

## 2024-03-29 DIAGNOSIS — M2042 Other hammer toe(s) (acquired), left foot: Secondary | ICD-10-CM | POA: Diagnosis not present

## 2024-03-29 DIAGNOSIS — L84 Corns and callosities: Secondary | ICD-10-CM | POA: Diagnosis not present

## 2024-03-29 DIAGNOSIS — M2041 Other hammer toe(s) (acquired), right foot: Secondary | ICD-10-CM | POA: Diagnosis not present

## 2024-04-01 ENCOUNTER — Encounter: Payer: Self-pay | Admitting: Cardiology

## 2024-04-01 DIAGNOSIS — I483 Typical atrial flutter: Secondary | ICD-10-CM | POA: Diagnosis not present

## 2024-04-01 DIAGNOSIS — I5021 Acute systolic (congestive) heart failure: Secondary | ICD-10-CM

## 2024-04-01 MED ORDER — BISOPROLOL FUMARATE 5 MG PO TABS: 5.0000 mg | ORAL_TABLET | Freq: Every day | ORAL | 3 refills | Status: DC

## 2024-04-01 NOTE — Telephone Encounter (Signed)
 Called and spoke to pt.  Heart Monitor results given; Dr. Elvis Coil recommendations given; pt verbalized understanding.  She will continue Eliquis and start new med, bisoprolol.  No other concerns. RX sent and f/u appt w/Dr. [place] made for 05/20/24.   Need to enter Echo Order (unsure of Dx, will send to Anabel Halon, RN).

## 2024-04-04 ENCOUNTER — Telehealth: Payer: Self-pay | Admitting: Cardiology

## 2024-04-04 ENCOUNTER — Other Ambulatory Visit: Payer: Self-pay

## 2024-04-04 DIAGNOSIS — I4891 Unspecified atrial fibrillation: Secondary | ICD-10-CM

## 2024-04-04 NOTE — Telephone Encounter (Signed)
 Pt returning call to a nurse

## 2024-04-04 NOTE — Telephone Encounter (Signed)
 Spoke to patient she stated she already had a echo 3/7.Stated when she was given monitor results she was told to repeat echo.Stated she started Bisoprolol 5 mg daily this past Sat, B/P today at GYN 120/64.Stated since she started Bisoprolol she has not felt fast heart beat.She has appointment with Dr.Jordan 5/30 at 3:40 pm.Advised I will send message to Dr.Jordan to see if he wants you to repeat echo.

## 2024-04-05 DIAGNOSIS — H353131 Nonexudative age-related macular degeneration, bilateral, early dry stage: Secondary | ICD-10-CM | POA: Diagnosis not present

## 2024-04-05 DIAGNOSIS — H25813 Combined forms of age-related cataract, bilateral: Secondary | ICD-10-CM | POA: Diagnosis not present

## 2024-04-05 NOTE — Telephone Encounter (Signed)
 Called patient left message on personal voice mail Dr.Jordan advised to repeat echo.Advised scheduler will call back with appointment.

## 2024-04-12 ENCOUNTER — Encounter: Payer: Self-pay | Admitting: Family Medicine

## 2024-04-12 NOTE — Telephone Encounter (Signed)
 Called patient left message on personal voice mail Echo scheduled 5/22 at 9:15 am.Follow up appointment scheduled with Dr.Jordan 5/30 at 3:40 pm.

## 2024-04-13 NOTE — Telephone Encounter (Signed)
 Echo scheduled 5/22 at 9:15 am at [facility].

## 2024-05-04 ENCOUNTER — Ambulatory Visit: Admitting: Cardiology

## 2024-05-11 DIAGNOSIS — Z1231 Encounter for screening mammogram for malignant neoplasm of breast: Secondary | ICD-10-CM | POA: Diagnosis not present

## 2024-05-12 ENCOUNTER — Ambulatory Visit (HOSPITAL_COMMUNITY)

## 2024-05-17 ENCOUNTER — Other Ambulatory Visit: Payer: Self-pay | Admitting: Family Medicine

## 2024-05-20 ENCOUNTER — Ambulatory Visit: Admitting: Cardiology

## 2024-06-17 ENCOUNTER — Ambulatory Visit (HOSPITAL_COMMUNITY)
Admission: RE | Admit: 2024-06-17 | Discharge: 2024-06-17 | Disposition: A | Discharge status: Home / Self Care | Source: Ambulatory Visit | Attending: Cardiology | Admitting: Cardiology

## 2024-06-17 ENCOUNTER — Ambulatory Visit: Payer: Self-pay | Admitting: Cardiology

## 2024-06-17 DIAGNOSIS — I4891 Unspecified atrial fibrillation: Secondary | ICD-10-CM | POA: Diagnosis not present

## 2024-06-17 LAB — ECHOCARDIOGRAM COMPLETE
Area-P 1/2: 3.03 cm2
Calc EF: 61.7 %
S' Lateral: 2.2 cm
Single Plane A2C EF: 68.5 %
Single Plane A4C EF: 52.8 %

## 2024-06-20 DIAGNOSIS — R928 Other abnormal and inconclusive findings on diagnostic imaging of breast: Secondary | ICD-10-CM | POA: Diagnosis not present

## 2024-06-26 NOTE — Progress Notes (Unsigned)
 Cardiology Office Note:    Date:  06/28/2024   ID:  Margaret Barton, DOB 1941-09-04, MRN [id number]  PCP:  Rosalynn Camie CROME, MD   [facility] Providers Cardiologist:  None     Referring MD: Rosalynn Camie CROME, MD   Chief Complaint  Patient presents with   Atrial Fibrillation    History of Present Illness:    Margaret Barton is a 83 y.o. female seen at the request of Dr Rosalynn for evaluation of Atrial flutter and CHF. She was treated in early Feb for the flu with viral PNA. Noted  to have a soft systolic murmur and sent for an Echo. This demonstrated reduced EF of 40-45% with global HK. Also noted to be in atrial flutter during exam. Was started on Eliquis  - lower dose for age and weight. Notes with PNA she really felt poor for 2 weeks but then got better. Has not been aware of arrhythmia. No chest pain or SOB. No prior cardiac history. Denies CM, HTN, or history of CVA.   Subsequent event monitor showed NSR with frequent runs of nonsustained SVT. No Afib. She was started on bisoprolol . Echo in June showed LV function back to normal.   On follow up today she feels great. No chest pain, dyspnea or palpitations. Energy level is good. She is very active gardening and swimming. She does note she bruises easily.   History reviewed. No pertinent past medical history.  Past Surgical History:  Procedure Laterality Date   ABDOMINAL HYSTERECTOMY     TOTAL HIP ARTHROPLASTY     Right   WRIST SURGERY Right     Current Medications: Current Meds  Medication Sig   Bisoprolol  Fumarate 2.5 MG TABS Take 2.5 mg by mouth daily at 2 PM.   Calcium Citrate-Vitamin D  (CALCIUM CITRATE+D3 PETITES) 200-6.25 MG-MCG TABS Take 1 tablet by mouth daily at 6 (six) AM.   citalopram  (CELEXA ) 20 MG tablet TAKE 1 TABLET(20 MG) BY MOUTH DAILY   estradiol  (ESTRADERM ) 0.1 MG/24HR patch Place 1 patch (0.1 mg total) onto the skin 2 (two) times a week.   [DISCONTINUED] apixaban  (ELIQUIS ) 2.5 MG TABS tablet Take 1 tablet (2.5  mg total) by mouth 2 (two) times daily.   [DISCONTINUED] bisoprolol  (ZEBETA ) 5 MG tablet Take 1 tablet (5 mg total) by mouth daily.     Allergies:   Cephalexin    Social History   Socioeconomic History   Marital status: Married    Spouse name: Signe   Number of children: 2   Years of education: Not on file   Highest education level: Not on file  Occupational History   Occupation: Professor    Employer: [employer]   Occupation: CELL  [phone]  Tobacco Use   Smoking status: Never   Smokeless tobacco: Never  Vaping Use   Vaping status: Never Used  Substance and Sexual Activity   Alcohol use: Yes    Alcohol/week: 4.0 standard drinks of alcohol    Types: 4 drink(s) per week   Drug use: No   Sexual activity: Yes  Other Topics Concern   Not on file  Social History Narrative   Health Care POA: Husband, Ariza Evans   Emergency Contact: Dr. Camie Rosalynn   End of Life Plan:    Who lives with you: Lives with husband on 20 acres of property   Any pets: 1 german shepard, 3 cats   Diet: Patient has a varied diet of protein, starch, and fruits and  vegetables   Exercise: Patient exercises 4-5 times a week.   Seatbelts: Patient reports wearing seat belt when in vehicle.   [place] Exposure/Protection: Patient reports wear sun screen daily.   Hobbies: Gardening, sports, teaching, cooking            Social Drivers of Health   Financial Resource Strain: Low Risk  (01/21/2024)   Overall Financial Resource Strain (CARDIA)    Difficulty of Paying Living Expenses: Not hard at all  Food Insecurity: No Food Insecurity (01/21/2024)   Hunger Vital Sign    Worried About Running Out of Food in the Last Year: Never true    Ran Out of Food in the Last Year: Never true  Transportation Needs: No Transportation Needs (01/21/2024)   PRAPARE - Administrator, Civil Service (Medical): No    Lack of Transportation (Non-Medical): No  Physical Activity: Sufficiently Active (01/21/2024)    Exercise Vital Sign    Days of Exercise per Week: 7 days    Minutes of Exercise per Session: 30 min  Stress: No Stress Concern Present (01/21/2024)   [employer] of Occupational Health - Occupational Stress Questionnaire    Feeling of Stress : Not at all  Social Connections: Socially Integrated (01/21/2024)   Social Connection and Isolation Panel    Frequency of Communication with Friends and Family: More than three times a week    Frequency of Social Gatherings with Friends and Family: More than three times a week    Attends Religious Services: More than 4 times per year    Active Member of [employer] or Organizations: Yes    Attends Banker Meetings: 1 to 4 times per year    Marital Status: Married     Family History: The patient's family history includes COPD in her father; Diabetes in her mother.  ROS:   Please see the history of present illness.     All other systems reviewed and are negative.  EKGs/Labs/Other Studies Reviewed:    The following studies were reviewed today:  EKG Interpretation Date/Time:  Tuesday June 28 2024 09:57:16 EDT Ventricular Rate:  40 PR Interval:  200 QRS Duration:  72 QT Interval:  462 QTC Calculation: 376 R Axis:   0  Text Interpretation: Marked sinus bradycardia Low voltage QRS Septal infarct (cited on or before 05-Aug-2003) When compared with ECG of 13-Feb-2009 12:55,  No significant change was found  Confirmed by [place], Damere Brandenburg [phone]) on 06/28/2024 10:06:43 AM   Echo 02/26/24: IMPRESSIONS     1. Left ventricular ejection fraction, by estimation, is 40 to 45%. The  left ventricle has mildly decreased function. The left ventricle  demonstrates global hypokinesis. There is mild concentric left ventricular  hypertrophy. Left ventricular diastolic  parameters are indeterminate.   2. Right ventricular systolic function is normal. The right ventricular  size is normal.   3. The mitral valve is normal in structure. No evidence  of mitral valve  regurgitation. No evidence of mitral stenosis.   4. The aortic valve is normal in structure. Aortic valve regurgitation is  not visualized. Aortic valve sclerosis/calcification is present, without  any evidence of aortic stenosis. Aortic valve area, by VTI measures 1.65  cm. Aortic valve mean gradient  measures 3.0 mmHg.   5. The inferior vena cava is normal in size with greater than 50%  respiratory variability, suggesting right atrial pressure of 3 mmHg.   Conclusion(s)/Recommendation(s): LVEF mild to moderately reduced (40-45%)  in setting of probable atrial  flutter. Have notified ordering physician.   Event monitor 04/01/24: Study Highlights      Normal sinus rhythm- average HR 72.   One 4 beat run NSVT   Frequent runs of SVT with RVR. longest lasting 12 minutes.   Occasional PACs  EKG Interpretation Date/Time:  Tuesday June 28 2024 09:57:16 EDT Ventricular Rate:  40 PR Interval:  200 QRS Duration:  72 QT Interval:  462 QTC Calculation: 376 R Axis:   0  Text Interpretation: Marked sinus bradycardia Low voltage QRS Septal infarct (cited on or before 05-Aug-2003) When compared with ECG of 13-Feb-2009 12:55,  No significant change was found  Confirmed by [place], Miguelina Fore [phone]) on 06/28/2024 10:06:43 AM   Echo 06/17/24: IMPRESSIONS     1. Left ventricular ejection fraction, by estimation, is 60 to 65%. Left  ventricular ejection fraction by 2D MOD biplane is 61.7 %. The left  ventricle has normal function. The left ventricle has no regional wall  motion abnormalities. Left ventricular  diastolic parameters are consistent with Grade I diastolic dysfunction  (impaired relaxation).   2. Right ventricular systolic function is mildly reduced. The right  ventricular size is normal. There is normal pulmonary artery systolic  pressure. The estimated right ventricular systolic pressure is 19.0 mmHg.   3. Left atrial size was mildly dilated.   4. The mitral valve  is degenerative. Mild mitral valve regurgitation.  Moderate to severe mitral annular calcification.   5. The aortic valve is tricuspid. Aortic valve regurgitation is not  visualized. Aortic valve sclerosis/calcification is present, without any  evidence of aortic stenosis.   6. The inferior vena cava is normal in size with greater than 50%  respiratory variability, suggesting right atrial pressure of 3 mmHg.   7. Cannot exclude a small PFO.   Comparison(s): Changes from prior study are noted. 02/26/2024: LVEF 40-45%.  Recent Labs: 08/26/2023: ALT 21 02/04/2024: BUN 11; Creatinine, Ser 0.71; Potassium 3.8; Sodium 138  Recent Lipid Panel    Component Value Date/Time   CHOL 174 08/26/2023 0919   TRIG 115 08/26/2023 0919   HDL 71 08/26/2023 0919   CHOLHDL 2.5 08/26/2023 0919   CHOLHDL 2.7 06/25/2016 0921   VLDL 29 06/25/2016 0921   LDLCALC 83 08/26/2023 0919     Risk Assessment/Calculations:    CHA2DS2-VASc Score = 3   This indicates a 3.2% annual risk of stroke. The patient's score is based upon: CHF History: 0 HTN History: 0 Diabetes History: 0 Stroke History: 0 Vascular Disease History: 0 Age Score: 2 Gender Score: 1       Physical Exam:    VS:  BP (!) 112/48 (BP Location: Left Arm, Patient Position: Sitting, Cuff Size: Normal)   Pulse (!) 40   Ht 5' 2 (1.575 m)   Wt 129 lb 9.6 oz (58.8 kg)   SpO2 96%   BMI 23.70 kg/m     Wt Readings from Last 3 Encounters:  06/28/24 129 lb 9.6 oz (58.8 kg)  03/16/24 128 lb 9.6 oz (58.3 kg)  03/07/24 130 lb (59 kg)     GEN:  Well nourished, well developed in no acute distress HEENT: Normal NECK: No JVD; No carotid bruits LYMPHATICS: No lymphadenopathy CARDIAC: RRR, no murmurs, rubs, gallops RESPIRATORY:  Clear to auscultation without rales, wheezing or rhonchi  ABDOMEN: Soft, non-tender, non-distended MUSCULOSKELETAL:  No edema; No deformity  SKIN: Warm and dry NEUROLOGIC:  Alert and oriented x 3 PSYCHIATRIC:  Normal  affect   ASSESSMENT:  1. Atrial fibrillation, unspecified type (HCC)   2. Acute systolic CHF (congestive heart failure) (HCC)   3. SVT (supraventricular tachycardia) (HCC)     PLAN:    In order of problems listed above:  Atrial flutter in setting of influenza infection and PNA. Likely triggered by acute illness. Confirmed back in NSR by Ecg per Dr Harvey on 03/01/24. Follow up event monitor showed no Afib.  I recommend stopping Eliquis  at this time and will monitor for recurrence.  LV dysfunction noted on Echo. Asymptomatic. I suspect this is more related to the Atrial flutter. Repeat Echo showed EF returned to normal  Nonsustained SVT- improved on bisoprolol  but pulse slow today. Will reduce bisoprolol  to 2.5 mg daily           Medication Adjustments/Labs and Tests Ordered: Current medicines are reviewed at length with the patient today.  Concerns regarding medicines are outlined above.  Orders Placed This Encounter  Procedures   EKG 12-Lead   Meds ordered this encounter  Medications   Bisoprolol  Fumarate 2.5 MG TABS    Sig: Take 2.5 mg by mouth daily at 2 PM.    Dispense:  90 tablet    Refill:  3    There are no Patient Instructions on file for this visit.   Signed, Keath Matera [place], MD  06/28/2024 10:15 AM    [facility]

## 2024-06-28 ENCOUNTER — Encounter: Payer: Self-pay | Admitting: Cardiology

## 2024-06-28 ENCOUNTER — Ambulatory Visit: Attending: Cardiology | Admitting: Cardiology

## 2024-06-28 VITALS — BP 112/48 | HR 40 | Ht 62.0 in | Wt 129.6 lb

## 2024-06-28 DIAGNOSIS — I471 Supraventricular tachycardia, unspecified: Secondary | ICD-10-CM

## 2024-06-28 DIAGNOSIS — I5021 Acute systolic (congestive) heart failure: Secondary | ICD-10-CM

## 2024-06-28 DIAGNOSIS — I4891 Unspecified atrial fibrillation: Secondary | ICD-10-CM | POA: Diagnosis not present

## 2024-06-28 MED ORDER — BISOPROLOL FUMARATE 2.5 MG PO TABS: 2.5000 mg | ORAL_TABLET | Freq: Every day | ORAL | 3 refills | Status: DC

## 2024-06-28 NOTE — Patient Instructions (Signed)
 Medication Instructions:  Decrease Bisoprolol  to 2.5 mg daily Stop Eliquis  Continue all other medications *If you need a refill on your cardiac medications before your next appointment, please call your pharmacy*  Lab Work: None ordered  Testing/Procedures: None ordered  Follow-Up: At [facility], you and your health needs are our priority.  As part of our continuing mission to provide you with exceptional heart care, our providers are all part of one team.  This team includes your primary Cardiologist (physician) and Advanced Practice Providers or APPs (Physician Assistants and Nurse Practitioners) who all work together to provide you with the care you need, when you need it.  Your next appointment:  6 months   Call in Sept to schedule Jan appointment     Provider:  Dr.Jordan   We recommend signing up for the patient portal called MyChart.  Sign up information is provided on this After Visit Summary.  MyChart is used to connect with patients for Virtual Visits (Telemedicine).  Patients are able to view lab/test results, encounter notes, upcoming appointments, etc.  Non-urgent messages can be sent to your provider as well.   To learn more about what you can do with MyChart, go to [email or web address].

## 2024-06-29 ENCOUNTER — Encounter: Payer: Self-pay | Admitting: Family Medicine

## 2024-06-29 DIAGNOSIS — I498 Other specified cardiac arrhythmias: Secondary | ICD-10-CM | POA: Insufficient documentation

## 2024-07-20 DIAGNOSIS — L570 Actinic keratosis: Secondary | ICD-10-CM | POA: Diagnosis not present

## 2024-07-20 DIAGNOSIS — Z08 Encounter for follow-up examination after completed treatment for malignant neoplasm: Secondary | ICD-10-CM | POA: Diagnosis not present

## 2024-07-20 DIAGNOSIS — L820 Inflamed seborrheic keratosis: Secondary | ICD-10-CM | POA: Diagnosis not present

## 2024-07-20 DIAGNOSIS — Z859 Personal history of malignant neoplasm, unspecified: Secondary | ICD-10-CM | POA: Diagnosis not present

## 2024-07-20 DIAGNOSIS — D692 Other nonthrombocytopenic purpura: Secondary | ICD-10-CM | POA: Diagnosis not present

## 2024-07-27 ENCOUNTER — Other Ambulatory Visit: Payer: Self-pay | Admitting: Family Medicine

## 2024-08-31 ENCOUNTER — Ambulatory Visit (INDEPENDENT_AMBULATORY_CARE_PROVIDER_SITE_OTHER): Admitting: Family Medicine

## 2024-08-31 VITALS — BP 125/45 | HR 82 | Wt 125.4 lb

## 2024-08-31 DIAGNOSIS — Z79890 Hormone replacement therapy (postmenopausal): Secondary | ICD-10-CM

## 2024-08-31 DIAGNOSIS — I498 Other specified cardiac arrhythmias: Secondary | ICD-10-CM

## 2024-08-31 DIAGNOSIS — M159 Polyosteoarthritis, unspecified: Secondary | ICD-10-CM

## 2024-08-31 MED ORDER — COVID-19 MRNA VAC-TRIS(PFIZER) 30 MCG/0.3ML IM SUSY: 0.3000 mL | PREFILLED_SYRINGE | Freq: Once | INTRAMUSCULAR | 0 refills | Status: AC

## 2024-09-01 MED ORDER — ESTRADIOL 0.1 MG/24HR TD PTTW: 1.0000 | MEDICATED_PATCH | TRANSDERMAL | 3 refills | Status: AC

## 2024-09-01 MED ORDER — TRAMADOL HCL 50 MG PO TABS: ORAL_TABLET | ORAL | 3 refills | Status: AC

## 2024-09-01 MED ORDER — DICLOFENAC-MISOPROSTOL 75-0.2 MG PO TBEC: DELAYED_RELEASE_TABLET | ORAL | 3 refills | Status: AC

## 2024-09-01 NOTE — Progress Notes (Signed)
   Discussed the use of AI scribe software for clinical note transcription with the patient, who gave verbal consent to proceed.  History of Present Illness   Here for yearly well visit    Physical Exam   VITALS: BP- 125/45        Osteoarthritis with chronic pain including sciatica and hip pain Chronic osteoarthritis with sciatica and hip pain, managed with tramadol  and diclofenac . No recent exacerbations requiring daily tramadol . - Prescribed tramadol , up to four times daily as needed. Advised against driving when taking tramadol . - Prescribed diclofenac  (Arthrotec) 75 mg daily, with option to increase to twice daily if needed.  Thin skin with easy bruising and delayed healing Thin skin with easy bruising and delayed healing due to age and sun damage. Bruising reduced after discontinuation of blood thinners.      PERTINENT  PMH / PSH: I have reviewed the patient's medications, allergies, past medical and surgical history, smoking status.  Pertinent findings that relate to today's visit / issues include: Has stropped her citalopram  to see if she still needs it Vital signs reviewed. GENERAL: Well-developed, well-nourished, no acute distress. CARDIOVASCULAR: Regular rate and rhythm no murmur gallop or rub LUNGS: Clear to auscultation bilaterally, no rales or wheeze. ABDOMEN: Soft positive bowel sounds NEURO: No gross focal neurological deficits. MSK: Movement of extremity x 4. PSYCH: AxOx4. Good eye contact.. No psychomotor retardation or agitation. Appropriate speech fluency and content. Asks and answers questions appropriately. Mood is congruent.    Assessment & Plan Degenerative joint disease involving multiple joints on both sides of body For arthritis pain, tramadol  up to QID. Continue arthrotec, she uses one a day typically and tries not to use second one. No stomach issues.  Hormone replacement therapy (postmenopausal) Continue meds, refilled Hx of Arrhythmia,  atrial Cardiology said no need for anticoagulation as a fib was related to sentinel event (pneumonia) and has resolved Well adult Do not need labs Health maintenance UTD and gave her Rx for COVID shot Influenza vaccine given Reviewed healthy lifestyle which she follows already

## 2024-09-01 NOTE — Assessment & Plan Note (Addendum)
 Cardiology said no need for anticoagulation as a fib was related to sentinel event (pneumonia) and has resolved

## 2024-09-01 NOTE — Assessment & Plan Note (Addendum)
 Continue meds, refilled

## 2024-09-01 NOTE — Assessment & Plan Note (Addendum)
 For arthritis pain, tramadol  up to QID. Continue arthrotec, she uses one a day typically and tries not to use second one. No stomach issues.

## 2024-09-09 DIAGNOSIS — S51812A Laceration without foreign body of left forearm, initial encounter: Secondary | ICD-10-CM | POA: Diagnosis not present

## 2024-09-09 DIAGNOSIS — L0390 Cellulitis, unspecified: Secondary | ICD-10-CM | POA: Diagnosis not present

## 2024-10-21 DIAGNOSIS — Z791 Long term (current) use of non-steroidal anti-inflammatories (NSAID): Secondary | ICD-10-CM | POA: Diagnosis not present

## 2024-10-21 DIAGNOSIS — M1990 Unspecified osteoarthritis, unspecified site: Secondary | ICD-10-CM | POA: Diagnosis not present

## 2024-10-21 DIAGNOSIS — R011 Cardiac murmur, unspecified: Secondary | ICD-10-CM | POA: Diagnosis not present

## 2024-10-21 DIAGNOSIS — M2040 Other hammer toe(s) (acquired), unspecified foot: Secondary | ICD-10-CM | POA: Diagnosis not present

## 2024-10-21 DIAGNOSIS — E785 Hyperlipidemia, unspecified: Secondary | ICD-10-CM | POA: Diagnosis not present

## 2024-10-21 DIAGNOSIS — M545 Low back pain, unspecified: Secondary | ICD-10-CM | POA: Diagnosis not present

## 2024-10-21 DIAGNOSIS — N959 Unspecified menopausal and perimenopausal disorder: Secondary | ICD-10-CM | POA: Diagnosis not present

## 2024-10-21 DIAGNOSIS — Z833 Family history of diabetes mellitus: Secondary | ICD-10-CM | POA: Diagnosis not present

## 2024-10-21 DIAGNOSIS — I10 Essential (primary) hypertension: Secondary | ICD-10-CM | POA: Diagnosis not present

## 2024-11-29 ENCOUNTER — Telehealth: Payer: Self-pay | Admitting: Cardiology

## 2024-11-29 MED ORDER — BISOPROLOL FUMARATE 2.5 MG PO TABS: 2.5000 mg | ORAL_TABLET | Freq: Every day | ORAL | 3 refills | Status: DC

## 2024-11-29 NOTE — Telephone Encounter (Signed)
 Received call from patient she stated for the past 5 days her heart rate has been over 100.Yesterday 134.Today 142.B/P today 122/75.After reviewing medications she stated she stopped taking Bisoprolol  in July when Eliquis  was started.Stated she feels ok.She is under a lot of stress with her sick son.Advised she needs to start back taking Bisoprolol .Advised to take 5 mg today.I will send message to Dr.Jordan for advice.

## 2024-11-29 NOTE — Telephone Encounter (Signed)
 Patient c/o Palpitations: STAT if patient c/o lightheadedness, shortness of breath, or chest pain  How long have you had palpitations/irregular HR/ Afib? Are you having the symptoms now? Palpitations/tachycardia for about the past 1 week. Still feeling palpitations currently.  Are you currently experiencing lightheadedness, SOB or CP?  No   Do you have a history of afib (atrial fibrillation) or irregular heart rhythm?  No   Have you checked your BP or HR? (document readings if available):  12/08: 142 12/09: 134, then 142, 129 bpm currently  Per patient, BP is fine, 122/75. O2 is fine, 99.  Are you experiencing any other symptoms?  Increased stress due to family matters.

## 2024-11-30 NOTE — Telephone Encounter (Signed)
 Spoke to patient Dr.Jordan advised ok to take Bisoprolol  5 mg today.Stated she already feels better.Appointment scheduled with him tomorrow 12/11 at 2:00 pm.

## 2024-12-01 ENCOUNTER — Encounter: Payer: Self-pay | Admitting: Cardiology

## 2024-12-01 ENCOUNTER — Ambulatory Visit: Attending: Cardiology | Admitting: Cardiology

## 2024-12-01 VITALS — BP 116/74 | HR 46 | Ht 61.0 in | Wt 130.0 lb

## 2024-12-01 DIAGNOSIS — I4891 Unspecified atrial fibrillation: Secondary | ICD-10-CM | POA: Diagnosis not present

## 2024-12-01 DIAGNOSIS — I483 Typical atrial flutter: Secondary | ICD-10-CM

## 2024-12-01 DIAGNOSIS — R011 Cardiac murmur, unspecified: Secondary | ICD-10-CM | POA: Diagnosis not present

## 2024-12-01 DIAGNOSIS — I471 Supraventricular tachycardia, unspecified: Secondary | ICD-10-CM | POA: Diagnosis not present

## 2024-12-01 DIAGNOSIS — I5021 Acute systolic (congestive) heart failure: Secondary | ICD-10-CM | POA: Diagnosis not present

## 2024-12-01 NOTE — Addendum Note (Signed)
 Addended by: CHRISTIANNE CHANNING PARAS on: 12/01/2024 02:45 PM   Modules accepted: Orders

## 2024-12-01 NOTE — Progress Notes (Signed)
 Cardiology Office Note:    Date:  12/01/2024   ID:  Margaret Barton, DOB [birth year], MRN [id number]  PCP:  Rosalynn Camie CROME, MD   [facility] Providers Cardiologist:  None     Referring MD: Rosalynn Camie CROME, MD   Chief Complaint  Patient presents with   Atrial Flutter    History of Present Illness:    Margaret Barton is a 83 y.o. female seen as a work in for evaluation of tachycardia.  She was treated in early Feb for the flu with viral PNA. Noted  to have a soft systolic murmur and sent for an Echo. This demonstrated reduced EF of 40-45% with global HK. Also noted to be in atrial flutter during exam. Was started on Eliquis  - lower dose for age and weight. Notes with PNA she really felt poor for 2 weeks but then got better. Has not been aware of arrhythmia. No chest pain or SOB. No prior cardiac history. Denies CM, HTN, or history of CVA.   Subsequent event monitor showed NSR with frequent runs of nonsustained SVT. No Afib. She was started on bisoprolol . Echo in June showed LV function back to normal.   This past week she noted her HR was very fast with HR up to 140-170. This was by a wrist monitor and pulse ox. We recommended she take bisoprolol  until seen today. Now HR back down. States she has been very stressed about her son who has been ill and is having a bone marrow biopsy.   No past medical history on file.  Past Surgical History:  Procedure Laterality Date   ABDOMINAL HYSTERECTOMY     TOTAL HIP ARTHROPLASTY     Right   WRIST SURGERY Right     Current Medications: Current Meds  Medication Sig   Bisoprolol  Fumarate 2.5 MG TABS Take 2.5 mg by mouth daily at 2 PM.   Calcium Citrate-Vitamin D  (CALCIUM CITRATE+D3 PETITES) 200-6.25 MG-MCG TABS Take 1 tablet by mouth daily at 6 (six) AM.   Diclofenac -miSOPROStol  75-0.2 MG TBEC Take one by mouth twice daily as needed   ELIQUIS  2.5 MG TABS tablet Take 2.5 mg by mouth 2 (two) times daily.   estradiol  (VIVELLE -DOT) 0.1 MG/24HR  patch Place 1 patch (0.1 mg total) onto the skin 2 (two) times a week.   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 Camie Rosalynn   End of Life Plan:    Who lives with you: Lives with husband on 20 acres of property   Any pets: 1 german shepard, 3 cats  Diet: Patient has a varied diet of protein, starch, and fruits and vegetables   Exercise: Patient exercises 4-5 times a week.   Seatbelts: Patient reports wearing seat belt when in vehicle.   [place] Exposure/Protection: Patient reports wear sun screen daily.   Hobbies: Gardening, sports, teaching, cooking            Social Drivers of Health   Tobacco Use: Low Risk (12/01/2024)   Patient History    Smoking Tobacco Use: Never    Smokeless Tobacco Use: Never    Passive Exposure: Not on file  Financial Resource Strain: Low Risk (01/21/2024)   Overall Financial Resource Strain (CARDIA)    Difficulty of Paying Living Expenses: Not hard at all  Food Insecurity: No Food Insecurity (01/21/2024)   Hunger Vital Sign    Worried About Running Out of Food in the Last Year: Never true    Ran Out of Food in the Last Year: Never true  Transportation Needs: No Transportation Needs (01/21/2024)   PRAPARE -  Administrator, Civil Service (Medical): No    Lack of Transportation (Non-Medical): No  Physical Activity: Sufficiently Active (01/21/2024)   Exercise Vital Sign    Days of Exercise per Week: 7 days    Minutes of Exercise per Session: 30 min  Stress: No Stress Concern Present (01/21/2024)   [employer] of Occupational Health - Occupational Stress Questionnaire    Feeling of Stress : Not at all  Social Connections: Socially Integrated (01/21/2024)   Social Connection and Isolation Panel    Frequency of Communication with Friends and Family: More than three times a week    Frequency of Social Gatherings with Friends and Family: More than three times a week    Attends Religious Services: More than 4 times per year    Active Member of Clubs or Organizations: Yes    Attends Banker Meetings: 1 to 4 times per year    Marital Status: Married  Depression (PHQ2-9): Medium Risk (08/31/2024)   Depression (PHQ2-9)    PHQ-2 Score: 6  Alcohol Screen: Low Risk (01/21/2024)   Alcohol Screen    Last Alcohol Screening Score (AUDIT): 2  Housing: Low Risk (01/21/2024)   Housing Stability Vital Sign    Unable to Pay for Housing in the Last Year: No    Number of Times Moved in the Last Year: 0    Homeless in the Last Year: No  Utilities: Not At Risk (01/21/2024)   AHC Utilities    Threatened with loss of utilities: No  Health Literacy: Adequate Health Literacy (01/21/2024)   B1300 Health Literacy    Frequency of need for help with medical instructions: Never     Family History: The patient's family history includes COPD in her father; Diabetes in her mother.  ROS:   Please see the history of present illness.     All other systems reviewed and are negative.  EKGs/Labs/Other Studies Reviewed:    The following studies were reviewed today:  EKG Interpretation Date/Time:  Thursday December 01 2024 14:07:58 EST Ventricular Rate:  46 PR Interval:  180 QRS  Duration:  82 QT Interval:  442 QTC Calculation: 386 R Axis:   41  Text Interpretation: Sinus bradycardia Low voltage QRS When compared with ECG of 28-Jun-2024 09:57, Criteria for Septal infarct are no longer Present Confirmed by Dnyla Antonetti [phone]) on 12/01/2024 2:14:05 PM   Echo 02/26/24: IMPRESSIONS     1. Left ventricular ejection fraction, by  estimation, is 40 to 45%. The  left ventricle has mildly decreased function. The left ventricle  demonstrates global hypokinesis. There is mild concentric left ventricular  hypertrophy. Left ventricular diastolic  parameters are indeterminate.   2. Right ventricular systolic function is normal. The right ventricular  size is normal.   3. The mitral valve is normal in structure. No evidence of mitral valve  regurgitation. No evidence of mitral stenosis.   4. The aortic valve is normal in structure. Aortic valve regurgitation is  not visualized. Aortic valve sclerosis/calcification is present, without  any evidence of aortic stenosis. Aortic valve area, by VTI measures 1.65  cm. Aortic valve mean gradient  measures 3.0 mmHg.   5. The inferior vena cava is normal in size with greater than 50%  respiratory variability, suggesting right atrial pressure of 3 mmHg.   Conclusion(s)/Recommendation(s): LVEF mild to moderately reduced (40-45%)  in setting of probable atrial flutter. Have notified ordering physician.   Event monitor 04/01/24: Study Highlights      Normal sinus rhythm- average HR 72.   One 4 beat run NSVT   Frequent runs of SVT with RVR. longest lasting 12 minutes.   Occasional PACs  EKG Interpretation Date/Time:  Thursday December 01 2024 14:07:58 EST Ventricular Rate:  46 PR Interval:  180 QRS Duration:  82 QT Interval:  442 QTC Calculation: 386 R Axis:   41  Text Interpretation: Sinus bradycardia Low voltage QRS When compared with ECG of 28-Jun-2024 09:57, Criteria for Septal infarct are no longer Present Confirmed by  Jerre Diguglielmo [phone]) on 12/01/2024 2:14:05 PM   Echo 06/17/24: IMPRESSIONS     1. Left ventricular ejection fraction, by estimation, is 60 to 65%. Left  ventricular ejection fraction by 2D MOD biplane is 61.7 %. The left  ventricle has normal function. The left ventricle has no regional wall  motion abnormalities. Left ventricular  diastolic parameters are consistent with Grade I diastolic dysfunction  (impaired relaxation).   2. Right ventricular systolic function is mildly reduced. The right  ventricular size is normal. There is normal pulmonary artery systolic  pressure. The estimated right ventricular systolic pressure is 19.0 mmHg.   3. Left atrial size was mildly dilated.   4. The mitral valve is degenerative. Mild mitral valve regurgitation.  Moderate to severe mitral annular calcification.   5. The aortic valve is tricuspid. Aortic valve regurgitation is not  visualized. Aortic valve sclerosis/calcification is present, without any  evidence of aortic stenosis.   6. The inferior vena cava is normal in size with greater than 50%  respiratory variability, suggesting right atrial pressure of 3 mmHg.   7. Cannot exclude a small PFO.   Comparison(s): Changes from prior study are noted. 02/26/2024: LVEF 40-45%.  Recent Labs: 02/04/2024: BUN 11; Creatinine, Ser 0.71; Potassium 3.8; Sodium 138  Recent Lipid Panel    Component Value Date/Time   CHOL 174 08/26/2023 0919   TRIG 115 08/26/2023 0919   HDL 71 08/26/2023 0919   CHOLHDL 2.5 08/26/2023 0919   CHOLHDL 2.7 06/25/2016 0921   VLDL 29 06/25/2016 0921   LDLCALC 83 08/26/2023 0919     Risk Assessment/Calculations:    CHA2DS2-VASc Score = 3   This indicates a 3.2% annual risk of stroke. The patient's score is based upon: CHF History: 0 HTN History: 0 Diabetes History: 0 Stroke History: 0 Vascular Disease History: 0 Age Score: 2 Gender Score: 1       Physical Exam:    VS:  BP 116/74   Pulse (!) 46   Ht 5' 1  (1.549 m)   Wt 130 lb (59 kg)   SpO2 96%   BMI 24.56 kg/m     Wt Readings from Last 3 Encounters:  12/01/24 130 lb (59 kg)  08/31/24 125 lb 6.4 oz (56.9 kg)  06/28/24 129 lb 9.6 oz (58.8 kg)     GEN:  Well nourished, well developed in no acute distress HEENT: Normal NECK: No JVD; No carotid bruits LYMPHATICS: No lymphadenopathy CARDIAC: RRR, no murmurs, rubs, gallops RESPIRATORY:  Clear to auscultation without rales, wheezing or rhonchi  ABDOMEN: Soft, non-tender, non-distended MUSCULOSKELETAL:  No edema; No deformity  SKIN: Warm and dry NEUROLOGIC:  Alert and oriented x 3 PSYCHIATRIC:  Normal affect   ASSESSMENT:    1. Atrial fibrillation, unspecified type (HCC)   2. Acute systolic CHF (congestive heart failure) (HCC)   3. SVT (supraventricular tachycardia)   4. Typical atrial flutter (HCC)   5. Murmur, cardiac      PLAN:    In order of problems listed above:  Atrial flutter in setting of influenza infection and PNA. Likely triggered by acute illness. Concerned that she may have had recurrent Aflutter this week vs SVT. Now back in NSR. Recommend she continue Eliquis  2.5 mg bid. Hold bisoprolol  due to bradycardia. Recommend she invest in a Kardiomobile device so we can get a rhythm strip if her HR increases again. It doesn't look like she will tolerate a beta blocker with her slow HR  LV dysfunction noted on Echo. Asymptomatic. I suspect this is more related to the Atrial flutter. Repeat Echo showed EF returned to normal  Nonsustained SVT- improved on bisoprolol  but pulse slow    Will keep follow up visit in Jan 12.            Medication Adjustments/Labs and Tests Ordered: Current medicines are reviewed at length with the patient today.  Concerns regarding medicines are outlined above.  Orders Placed This Encounter  Procedures   EKG 12-Lead   No orders of the defined types were placed in this encounter.   There are no Patient Instructions on file for this  visit.   Signed, Kiamesha Samet, MD  12/01/2024 2:24 PM    [facility]

## 2024-12-01 NOTE — Patient Instructions (Signed)
 Medication Instructions:  Stop Bisoprolol    Continue all other medications  *If you need a refill on your cardiac medications before your next appointment, please call your pharmacy*  Lab Work: None ordered  Testing/Procedures: None ordered  Follow-Up: At [facility], you and your health needs are our priority.  As part of our continuing mission to provide you with exceptional heart care, our providers are all part of one team.  This team includes your primary Cardiologist (physician) and Advanced Practice Providers or APPs (Physician Assistants and Nurse Practitioners) who all work together to provide you with the care you need, when you need it.  Your next appointment:  Monday 01/02/25 at 9:20 am    Provider:  Dr.Jordan            Buy at [facility]    We recommend signing up for the patient portal called MyChart.  Sign up information is provided on this After Visit Summary.  MyChart is used to connect with patients for Virtual Visits (Telemedicine).  Patients are able to view lab/test results, encounter notes, upcoming appointments, etc.  Non-urgent messages can be sent to your provider as well.   To learn more about what you can do with MyChart, go to [email or web address].

## 2024-12-07 ENCOUNTER — Encounter: Payer: Self-pay | Admitting: Cardiology

## 2024-12-30 NOTE — Progress Notes (Unsigned)
 " Cardiology Office Note:    Date:  12/30/2024   ID:  Margaret Barton, DOB 10/28/1941, MRN [id number]  PCP:  Rosalynn Camie CROME, MD   [facility] Providers Cardiologist:  None     Referring MD: Rosalynn Camie CROME, MD   No chief complaint on file.   History of Present Illness:    Margaret Barton is a 84 y.o. female seen as a work in for evaluation of tachycardia.  She was treated in early Feb for the flu with viral PNA. Noted  to have a soft systolic murmur and sent for an Echo. This demonstrated reduced EF of 40-45% with global HK. Also noted to be in atrial flutter during exam. Was started on Eliquis  - lower dose for age and weight. Notes with PNA she really felt poor for 2 weeks but then got better. Has not been aware of arrhythmia. No chest pain or SOB. No prior cardiac history. Denies CM, HTN, or history of CVA.   Subsequent event monitor showed NSR with frequent runs of nonsustained SVT. No Afib. She was started on bisoprolol . Echo in June showed LV function back to normal.   This past week she noted her HR was very fast with HR up to 140-170. This was by a wrist monitor and pulse ox. We recommended she take bisoprolol  until seen today. Now HR back down. States she has been very stressed about her son who has been ill and is having a bone marrow biopsy.   No past medical history on file.  Past Surgical History:  Procedure Laterality Date   ABDOMINAL HYSTERECTOMY     TOTAL HIP ARTHROPLASTY     Right   WRIST SURGERY Right     Current Medications: No outpatient medications have been marked as taking for the 01/02/25 encounter (Appointment) with Nailah Luepke M, MD.     Allergies:   Cephalexin    Social History   Socioeconomic History   Marital status: Married    Spouse name: Signe   Number of children: 2   Years of education: Not on file   Highest education level: Not on file  Occupational History   Occupation: Professor    Employer: [employer]   Occupation: CELL   [phone]  Tobacco Use   Smoking status: Never   Smokeless tobacco: Never  Vaping Use   Vaping status: Never Used  Substance and Sexual Activity   Alcohol use: Yes    Alcohol/week: 4.0 standard drinks of alcohol    Types: 4 drink(s) per week   Drug use: No   Sexual activity: Yes  Other Topics Concern   Not on file  Social History Narrative   Health Care POA: Husband, Verania Salberg   Emergency Contact: Dr. Camie Rosalynn   End of Life Plan:    Who lives with you: Lives with husband on 20 acres of property   Any pets: 1 german shepard, 3 cats   Diet: Patient has a varied diet of protein, starch, and fruits and vegetables   Exercise: Patient exercises 4-5 times a week.   Seatbelts: Patient reports wearing seat belt when in vehicle.   [place] Exposure/Protection: Patient reports wear sun screen daily.   Hobbies: Gardening, sports, teaching, cooking            Social Drivers of Health   Tobacco Use: Low Risk (12/01/2024)   Patient History    Smoking Tobacco Use: Never    Smokeless Tobacco Use: Never  Passive Exposure: Not on file  Financial Resource Strain: Low Risk (01/21/2024)   Overall Financial Resource Strain (CARDIA)    Difficulty of Paying Living Expenses: Not hard at all  Food Insecurity: No Food Insecurity (01/21/2024)   Hunger Vital Sign    Worried About Running Out of Food in the Last Year: Never true    Ran Out of Food in the Last Year: Never true  Transportation Needs: No Transportation Needs (01/21/2024)   PRAPARE - Administrator, Civil Service (Medical): No    Lack of Transportation (Non-Medical): No  Physical Activity: Sufficiently Active (01/21/2024)   Exercise Vital Sign    Days of Exercise per Week: 7 days    Minutes of Exercise per Session: 30 min  Stress: No Stress Concern Present (01/21/2024)   [employer] of Occupational Health - Occupational Stress Questionnaire    Feeling of Stress : Not at all  Social Connections: Socially Integrated  (01/21/2024)   Social Connection and Isolation Panel    Frequency of Communication with Friends and Family: More than three times a week    Frequency of Social Gatherings with Friends and Family: More than three times a week    Attends Religious Services: More than 4 times per year    Active Member of Clubs or Organizations: Yes    Attends Banker Meetings: 1 to 4 times per year    Marital Status: Married  Depression (PHQ2-9): Medium Risk (08/31/2024)   Depression (PHQ2-9)    PHQ-2 Score: 6  Alcohol Screen: Low Risk (01/21/2024)   Alcohol Screen    Last Alcohol Screening Score (AUDIT): 2  Housing: Low Risk (01/21/2024)   Housing Stability Vital Sign    Unable to Pay for Housing in the Last Year: No    Number of Times Moved in the Last Year: 0    Homeless in the Last Year: No  Utilities: Not At Risk (01/21/2024)   AHC Utilities    Threatened with loss of utilities: No  Health Literacy: Adequate Health Literacy (01/21/2024)   B1300 Health Literacy    Frequency of need for help with medical instructions: Never     Family History: The patient's family history includes COPD in her father; Diabetes in her mother.  ROS:   Please see the history of present illness.     All other systems reviewed and are negative.  EKGs/Labs/Other Studies Reviewed:    The following studies were reviewed today:      Echo 02/26/24: IMPRESSIONS     1. Left ventricular ejection fraction, by estimation, is 40 to 45%. The  left ventricle has mildly decreased function. The left ventricle  demonstrates global hypokinesis. There is mild concentric left ventricular  hypertrophy. Left ventricular diastolic  parameters are indeterminate.   2. Right ventricular systolic function is normal. The right ventricular  size is normal.   3. The mitral valve is normal in structure. No evidence of mitral valve  regurgitation. No evidence of mitral stenosis.   4. The aortic valve is normal in structure.  Aortic valve regurgitation is  not visualized. Aortic valve sclerosis/calcification is present, without  any evidence of aortic stenosis. Aortic valve area, by VTI measures 1.65  cm. Aortic valve mean gradient  measures 3.0 mmHg.   5. The inferior vena cava is normal in size with greater than 50%  respiratory variability, suggesting right atrial pressure of 3 mmHg.   Conclusion(s)/Recommendation(s): LVEF mild to moderately reduced (40-45%)  in setting of  probable atrial flutter. Have notified ordering physician.   Event monitor 04/01/24: Study Highlights      Normal sinus rhythm- average HR 72.   One 4 beat run NSVT   Frequent runs of SVT with RVR. longest lasting 12 minutes.   Occasional PACs      Echo 06/17/24: IMPRESSIONS     1. Left ventricular ejection fraction, by estimation, is 60 to 65%. Left  ventricular ejection fraction by 2D MOD biplane is 61.7 %. The left  ventricle has normal function. The left ventricle has no regional wall  motion abnormalities. Left ventricular  diastolic parameters are consistent with Grade I diastolic dysfunction  (impaired relaxation).   2. Right ventricular systolic function is mildly reduced. The right  ventricular size is normal. There is normal pulmonary artery systolic  pressure. The estimated right ventricular systolic pressure is 19.0 mmHg.   3. Left atrial size was mildly dilated.   4. The mitral valve is degenerative. Mild mitral valve regurgitation.  Moderate to severe mitral annular calcification.   5. The aortic valve is tricuspid. Aortic valve regurgitation is not  visualized. Aortic valve sclerosis/calcification is present, without any  evidence of aortic stenosis.   6. The inferior vena cava is normal in size with greater than 50%  respiratory variability, suggesting right atrial pressure of 3 mmHg.   7. Cannot exclude a small PFO.   Comparison(s): Changes from prior study are noted. 02/26/2024: LVEF 40-45%.  Recent  Labs: 02/04/2024: BUN 11; Creatinine, Ser 0.71; Potassium 3.8; Sodium 138  Recent Lipid Panel    Component Value Date/Time   CHOL 174 08/26/2023 0919   TRIG 115 08/26/2023 0919   HDL 71 08/26/2023 0919   CHOLHDL 2.5 08/26/2023 0919   CHOLHDL 2.7 06/25/2016 0921   VLDL 29 06/25/2016 0921   LDLCALC 83 08/26/2023 0919     Risk Assessment/Calculations:    CHA2DS2-VASc Score = 3   This indicates a 3.2% annual risk of stroke. The patient's score is based upon: CHF History: 0 HTN History: 0 Diabetes History: 0 Stroke History: 0 Vascular Disease History: 0 Age Score: 2 Gender Score: 1   {This patient has a significant risk of stroke if diagnosed with atrial fibrillation.  Please consider VKA or DOAC agent for anticoagulation if the bleeding risk is acceptable.   You can also use the SmartPhrase .[email or web address] for documentation.   :[id number]}    Physical Exam:    VS:  There were no vitals taken for this visit.    Wt Readings from Last 3 Encounters:  12/01/24 130 lb (59 kg)  08/31/24 125 lb 6.4 oz (56.9 kg)  06/28/24 129 lb 9.6 oz (58.8 kg)     GEN:  Well nourished, well developed in no acute distress HEENT: Normal NECK: No JVD; No carotid bruits LYMPHATICS: No lymphadenopathy CARDIAC: RRR, no murmurs, rubs, gallops RESPIRATORY:  Clear to auscultation without rales, wheezing or rhonchi  ABDOMEN: Soft, non-tender, non-distended MUSCULOSKELETAL:  No edema; No deformity  SKIN: Warm and dry NEUROLOGIC:  Alert and oriented x 3 PSYCHIATRIC:  Normal affect   ASSESSMENT:    No diagnosis found.    PLAN:    In order of problems listed above:  Atrial flutter in setting of influenza infection and PNA. Likely triggered by acute illness. Concerned that she may have had recurrent Aflutter this week vs SVT. Now back in NSR. Recommend she continue Eliquis  2.5 mg bid. Hold bisoprolol  due to bradycardia. Recommend she invest in a Kardiomobile device so  we can get a rhythm strip if  her HR increases again. It doesn't look like she will tolerate a beta blocker with her slow HR  LV dysfunction noted on Echo. Asymptomatic. I suspect this is more related to the Atrial flutter. Repeat Echo showed EF returned to normal  Nonsustained SVT- improved on bisoprolol  but pulse slow    Will keep follow up visit in Jan 12.            Medication Adjustments/Labs and Tests Ordered: Current medicines are reviewed at length with the patient today.  Concerns regarding medicines are outlined above.  No orders of the defined types were placed in this encounter.  No orders of the defined types were placed in this encounter.   There are no Patient Instructions on file for this visit.   Signed, Ilissa Rosner, MD  12/30/2024 7:29 AM    [facility]  "

## 2025-01-02 ENCOUNTER — Encounter: Payer: Self-pay | Admitting: Cardiology

## 2025-01-02 ENCOUNTER — Ambulatory Visit: Attending: Cardiology | Admitting: Cardiology

## 2025-01-02 DIAGNOSIS — I483 Typical atrial flutter: Secondary | ICD-10-CM | POA: Diagnosis not present

## 2025-01-02 NOTE — Patient Instructions (Signed)

## 2025-01-18 ENCOUNTER — Other Ambulatory Visit: Payer: Self-pay | Admitting: Family Medicine

## 2025-01-18 MED ORDER — OSELTAMIVIR PHOSPHATE 75 MG PO CAPS: 75.0000 mg | ORAL_CAPSULE | Freq: Two times a day (BID) | ORAL | 1 refills | Status: AC

## 2025-03-13 ENCOUNTER — Encounter
